# Patient Record
Sex: Male | Born: 1975
Health system: Southern US, Community
[De-identification: ages and names within clinical notes are randomized; demographics above are authoritative.]

## PROBLEM LIST (undated history)

## (undated) DIAGNOSIS — I10 Essential (primary) hypertension: Secondary | ICD-10-CM

## (undated) DIAGNOSIS — B019 Varicella without complication: Secondary | ICD-10-CM

## (undated) HISTORY — PX: NO PAST SURGERIES: SHX2092

## (undated) HISTORY — DX: Varicella without complication: B01.9

## (undated) HISTORY — DX: Essential (primary) hypertension: I10

---

## 2008-10-31 ENCOUNTER — Ambulatory Visit: Payer: Self-pay | Admitting: Cardiovascular Disease

## 2008-11-01 ENCOUNTER — Observation Stay (HOSPITAL_COMMUNITY): Admission: EM | Admit: 2008-11-01 | Discharge: 2008-11-01 | Payer: Self-pay | Admitting: Emergency Medicine

## 2008-11-01 ENCOUNTER — Encounter: Payer: Self-pay | Admitting: Internal Medicine

## 2010-05-11 IMAGING — CR DG CHEST 2V
2 series · 2 of 2 positions shown · non-contrast
Comparison: None

CLINICAL DATA: Left-sided chest pain for 2 weeks

CHEST - 2 VIEW

[w chest pa]
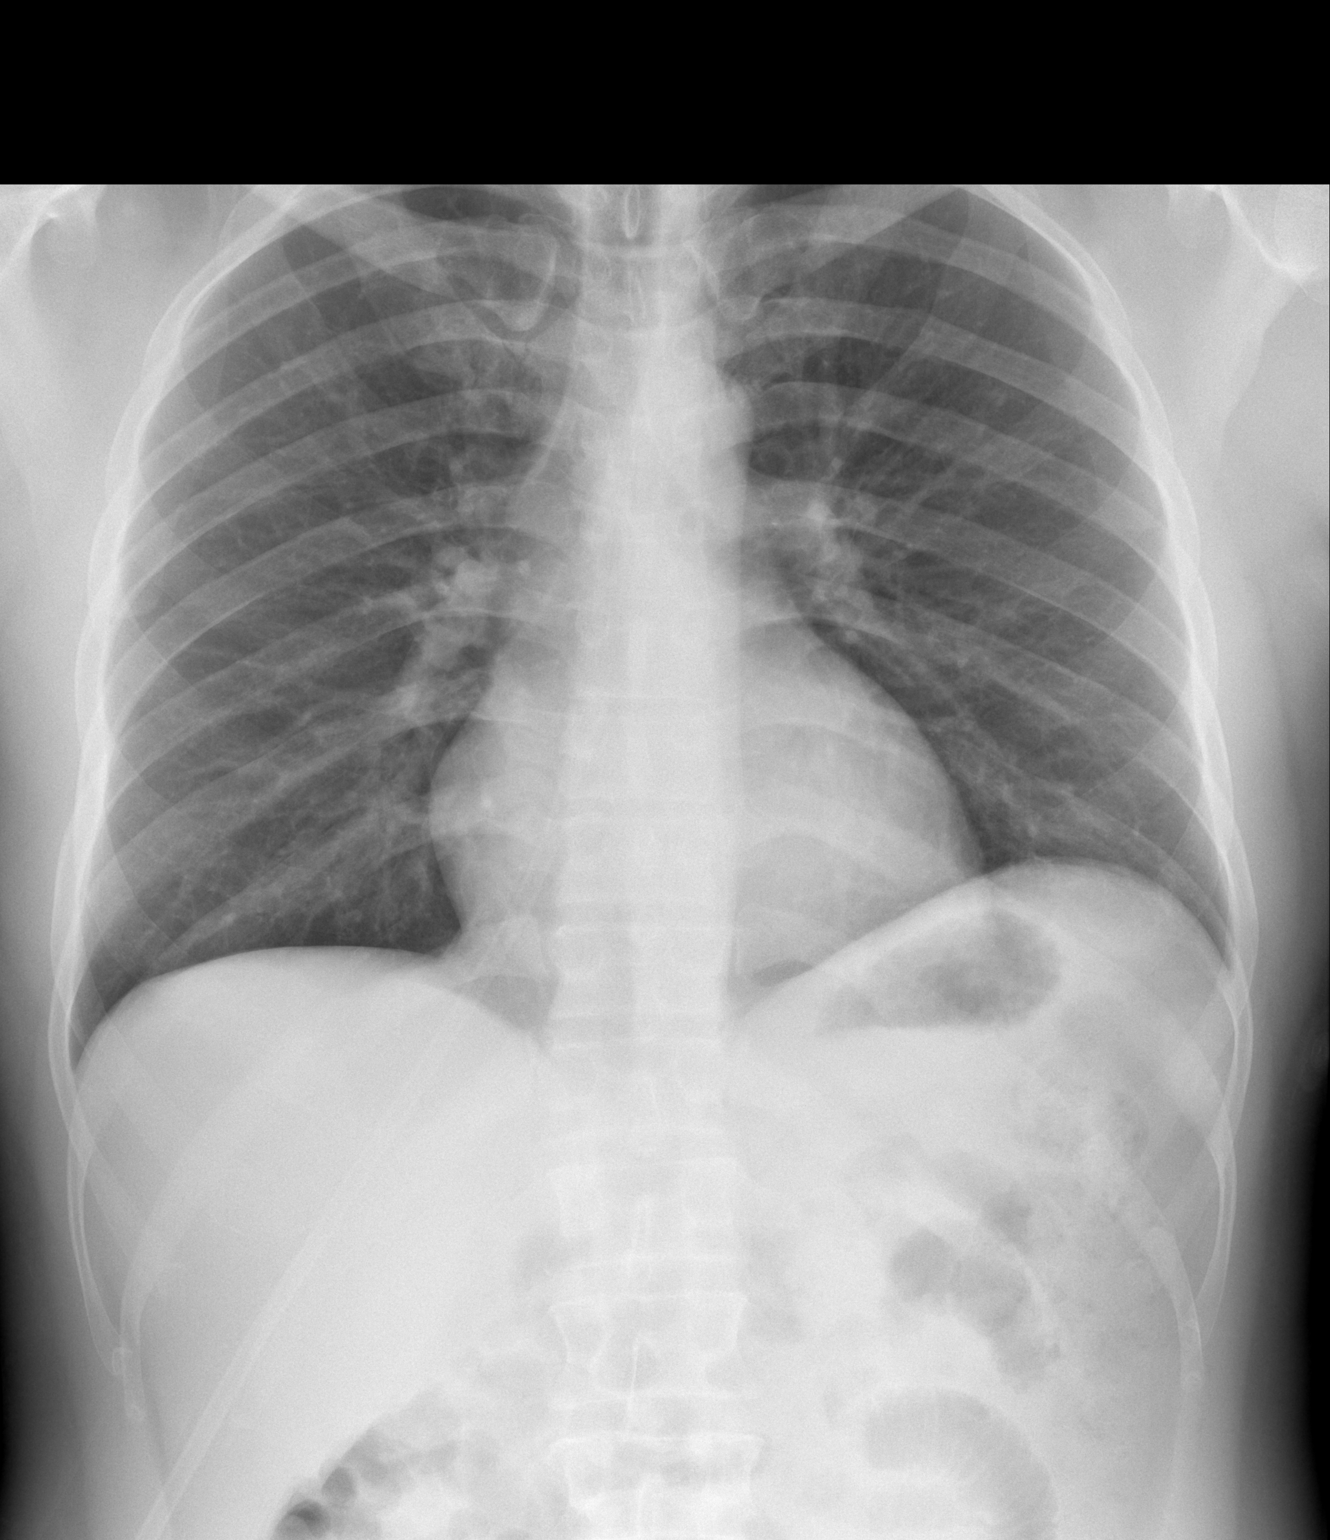

[w chest lat]
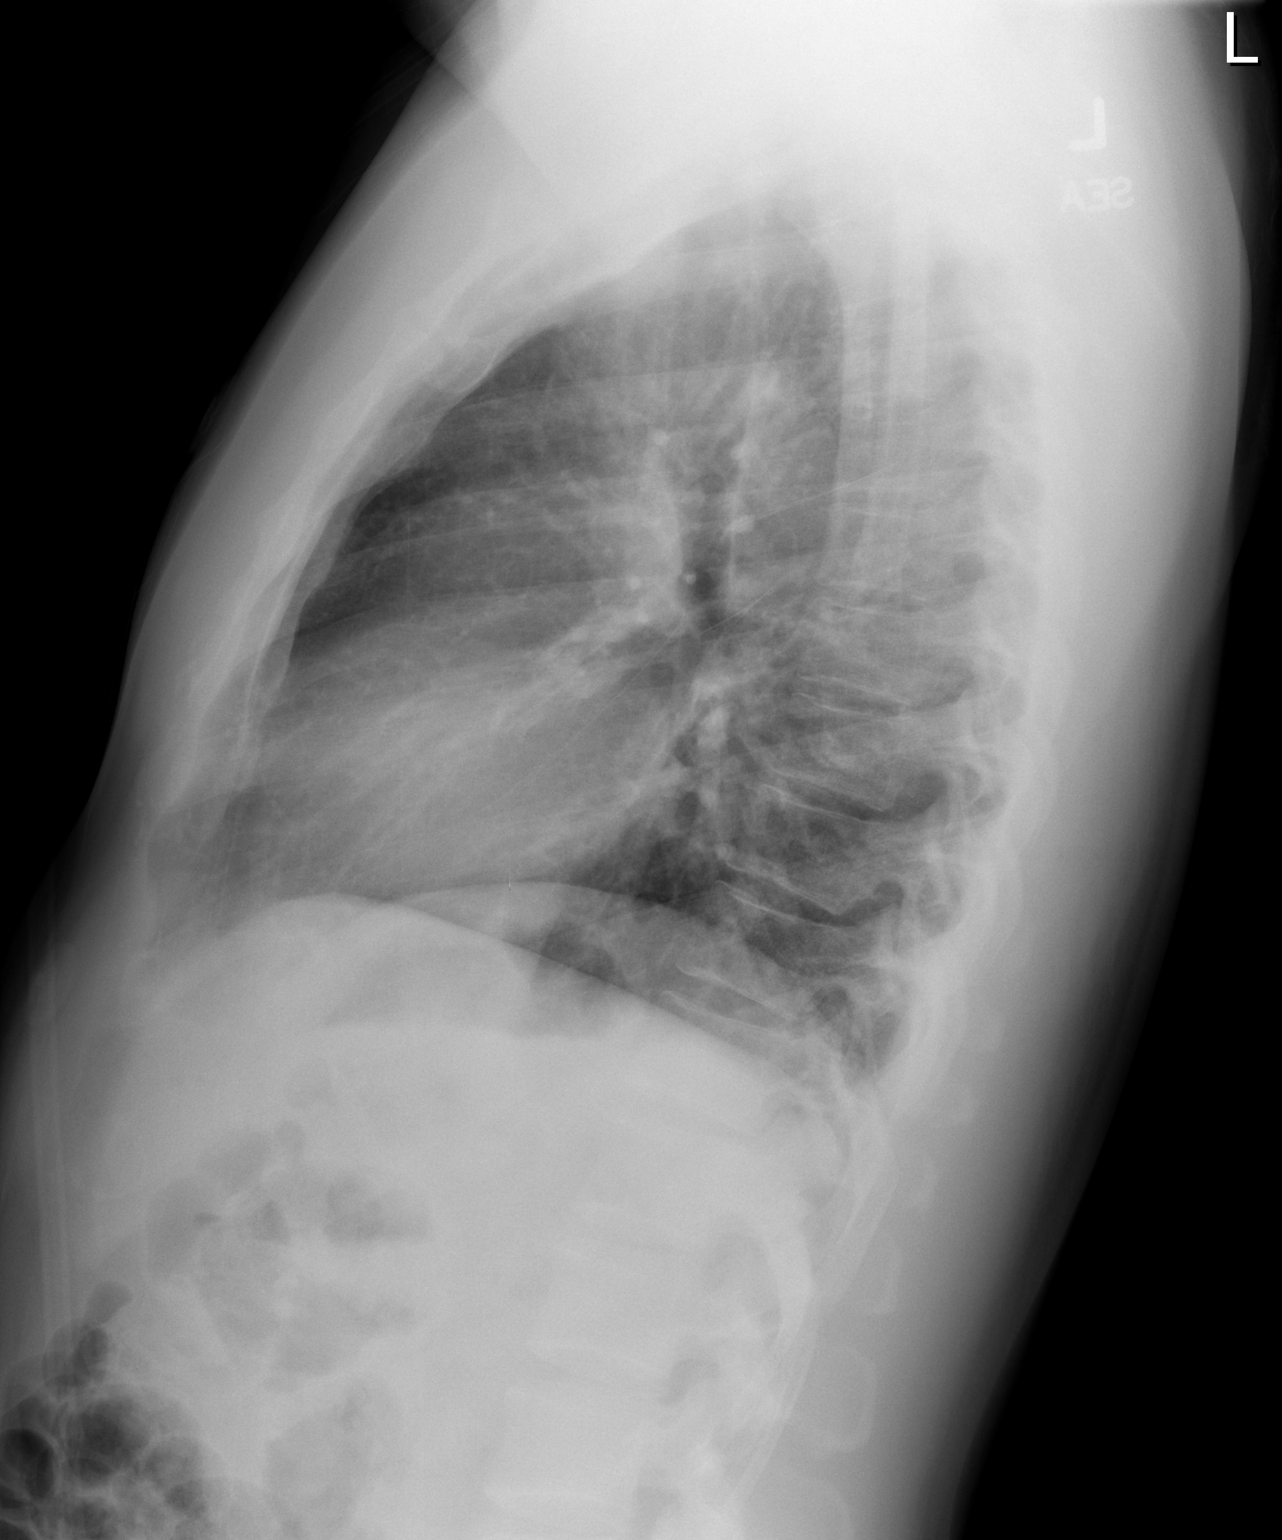

[2 of 2 positions shown; findings below may reference images not displayed]

FINDINGS: No active infiltrate or effusion is seen.  Slightly
prominent perihilar markings are noted.  The heart is within normal
limits in size.  No bony abnormality is seen.
IMPRESSION: No pneumonia.  Slightly prominent perihilar markings.

## 2010-07-01 LAB — DIFFERENTIAL
Basophils Absolute: 0 K/uL (ref 0.0–0.1)
Basophils Relative: 0 % (ref 0–1)
Eosinophils Absolute: 0 K/uL (ref 0.0–0.7)
Eosinophils Relative: 0 % (ref 0–5)
Lymphocytes Relative: 7 % — ABNORMAL LOW (ref 12–46)
Lymphs Abs: 1.2 K/uL (ref 0.7–4.0)
Monocytes Absolute: 1.1 K/uL — ABNORMAL HIGH (ref 0.1–1.0)
Monocytes Relative: 7 % (ref 3–12)
Neutro Abs: 13.9 K/uL — ABNORMAL HIGH (ref 1.7–7.7)
Neutrophils Relative %: 86 % — ABNORMAL HIGH (ref 43–77)

## 2010-07-01 LAB — CK TOTAL AND CKMB (NOT AT ARMC)
CK, MB: 0.5 ng/mL (ref 0.3–4.0)
Relative Index: 0.3 (ref 0.0–2.5)
Total CK: 160 U/L (ref 7–232)

## 2010-07-01 LAB — LIPID PANEL
HDL: 57 mg/dL
Total CHOL/HDL Ratio: 4.2 ratio
Triglycerides: 61 mg/dL
VLDL: 12 mg/dL (ref 0–40)

## 2010-07-01 LAB — URINALYSIS, ROUTINE W REFLEX MICROSCOPIC
Bilirubin Urine: NEGATIVE
Glucose, UA: NEGATIVE mg/dL
Hgb urine dipstick: NEGATIVE
Ketones, ur: NEGATIVE mg/dL
Nitrite: NEGATIVE
Protein, ur: NEGATIVE mg/dL
Specific Gravity, Urine: 1.013 (ref 1.005–1.030)
Urobilinogen, UA: 0.2 mg/dL (ref 0.0–1.0)
pH: 6 (ref 5.0–8.0)

## 2010-07-01 LAB — CBC
HCT: 46.1 % (ref 39.0–52.0)
Hemoglobin: 15.3 g/dL (ref 13.0–17.0)
MCHC: 33.3 g/dL (ref 30.0–36.0)
MCV: 83.9 fL (ref 78.0–100.0)
Platelets: 206 K/uL (ref 150–400)
RBC: 5.49 MIL/uL (ref 4.22–5.81)
RDW: 13.1 % (ref 11.5–15.5)
WBC: 16.2 K/uL — ABNORMAL HIGH (ref 4.0–10.5)

## 2010-07-01 LAB — POCT CARDIAC MARKERS
CKMB, poc: <1 ng/mL — ABNORMAL LOW (ref 1.0–8.0)
Myoglobin, poc: 55 ng/mL (ref 12–200)
Troponin i, poc: <0.05 ng/mL (ref 0.00–0.09)

## 2010-07-01 LAB — CARDIAC PANEL(CRET KIN+CKTOT+MB+TROPI): Relative Index: 0.3 (ref 0.0–2.5)

## 2010-07-01 LAB — SEDIMENTATION RATE: Sed Rate: 3 mm/hr (ref 0–16)

## 2010-07-01 LAB — POCT I-STAT, CHEM 8
BUN: 9 mg/dL (ref 6–23)
Creatinine, Ser: 1.2 mg/dL (ref 0.4–1.5)
Potassium: 4.1 mEq/L (ref 3.5–5.1)
Sodium: 138 mEq/L (ref 135–145)
TCO2: 27 mmol/L (ref 0–100)

## 2010-07-01 LAB — RAPID URINE DRUG SCREEN, HOSP PERFORMED: Barbiturates: NOT DETECTED

## 2010-07-01 LAB — TROPONIN I

## 2010-08-07 NOTE — H&P (Signed)
NAME:  Ruben Welch, TISO NO.:  [id number]   MEDICAL RECORD NO.:  [id number]          PATIENT TYPE:  OBV   LOCATION:  3707                         FACILITY:  [facility]   PHYSICIAN:  Christell Faith, MD   DATE OF BIRTH:  1975/04/02   DATE OF ADMISSION:  10/31/2008  DATE OF DISCHARGE:                              HISTORY & PHYSICAL   CHIEF COMPLAINT:  Chest pain.   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  Initially, the pain was mild, but it progressed over the course of 2-3  hours to become quite severe.  He recalled that it was exacerbated by  direct palpation over the left pectoralis muscle.  He drove from [place]  where he works, to an Urgent Care Center in [place], where  electrocardiogram was performed, which revealed inferolateral T wave  inversions, and I was contacted by the urgent care physician for  possible acute coronary syndrome.  The patient was transferred to the  [facility] Emergency Department and upon arrival is pain free and  asymptomatic.  For the past week and a half, he has felt very mild  twinges of pain in his left axilla, which he has attributed to  musculoskeletal in nature, however, today's was more diffuse and more  severe.  He jogs and is otherwise a young active healthy person and  reports no history of chest pain or dyspnea with exertion.   PAST MEDICAL HISTORY:  Possible heart murmur as a child.   FAMILY HISTORY:  Mother and father both alive, both with hypertension.   SOCIAL HISTORY:  He is married.  He and his wife live in [place],  however, he is a Psychologist, occupational in [place].  No tobacco.  No illegal drugs.  No  alcohol.   DRUG ALLERGIES:  None.   MEDICATIONS:  None including no herbs, supplements, or over-the-counter  medicines.   REVIEW OF SYSTEMS:  He  otherwise has no complaints.   PHYSICAL EXAMINATION:  VITAL SIGNS:  Temperature 98.9, pulse 88,  respiratory rate 18, blood pressure presently 127/81, initially at the  Urgent Care Center it is 178/106.  GENERAL:  This is a pleasant, young, healthy-appearing man, in no acute  distress.  HEENT:  Pupils are round and reactive.  Funduscopic exam reveals no  evidence of chronic hypertension.  NECK:  Supple.  Neck veins are flat.  No carotid bruits.  There is no  axillary lymphadenopathy.  LUNGS:  Clear to auscultation bilaterally without wheezing or rales.  CARDIAC:  Normal rate, regular rhythm.  No murmur.  No rub.  ABDOMEN:  Soft, nontender, nondistended.  Normal bowel sounds.  EXTREMITIES:  No edema.  2+ dorsalis pedis and radial pulses  bilaterally.  NEUROLOGIC:  5/5 strength in all 4 extremities.  Facial expression  symmetric.   DIAGNOSTIC TESTS:  Chest x-ray shows slightly prominent perihilar  markings, otherwise normal.  EKG shows sinus rhythm at 70 beats per  minute with  inferolateral T wave inversions that have the appearance of  changes secondary to left ventricular hypertrophy.  However, normal  variant and ischemic changes cannot be entirely excluded.  We have no  old EKG for comparison.  Point-of-care cardiac enzymes were undetectable  x2.  Sodium 138, potassium 4.1, BUN 9, creatinine 1.2, white blood cells  16, hemoglobin 15.   IMPRESSION:  This is a 35 year old African American male with no  cardiovascular risk factors who had an episode of atypical chest pain  today, however, his EKG is abnormal, and he has an elevated white blood  cell count.   PLAN:  1. Based on the abnormal EKG, I think it is most prudent to admit him      to an observation unit overnight.  He will be placed on telemetry.      Myocardial infarction will be ruled out with serial EKGs and      cardiac enzymes.  2. Given his family history of hypertension and childhood murmur, we      will obtain an  echocardiogram to evaluate for left ventricular      hypertrophy or wall motion abnormalities.  3. Urine drug screen.  4. Continue aspirin, however we will withhold anticoagulation unless      his cardiac enzymes become abnormal or his pain recurs.  5. Check D-dimer to rule out venous thromboembolism.  6. The most likely etiology of his symptoms is an inflammatory      process, which will probably work itself out, however I had a long      discussion with the patient and his wife that the safest course of      action is to be observed overnight in the hospital and he      reluctantly agrees with this plan.  Further testing if needed could      possibly be deferred to the outpatient setting.      Christell Faith, MD  Electronically Signed     NDL/MEDQ  D:  10/31/2008  T:  11/01/2008  Job:  [id number]

## 2010-08-10 NOTE — Discharge Summary (Signed)
NAME:  Ruben Welch, Ruben Welch NO.:  [id number]   MEDICAL RECORD NO.:  [id number]          PATIENT TYPE:  OBV   LOCATION:  3707                         FACILITY:  [facility]   PHYSICIAN:  Verne Carrow, MDDATE OF BIRTH:  02-12-1976   DATE OF ADMISSION:  10/31/2008  DATE OF DISCHARGE:  11/01/2008                               DISCHARGE SUMMARY   HOSPITAL COURSE:  Mr. Muegge is a 35 year old African American male  who had a sudden onset of chest pain and was admitted to the hospital.  He had no significant past medical or past surgical history.  His first  3 sets of cardiac enzymes were negative.  His D-dimer was negative.  Plans were made for an echocardiogram and a stress Myoview to be  performed in the hospital.  The patient left the hospital against  medical advice prior to having the results of his testing communicated.  His echocardiogram showed normal cavity size with normal wall thickness.  Ejection fraction was estimated at 65%.  Wall motion was normal.  There  were no regional wall motion abnormalities.  There was what appeared to  be diastolic dysfunction.  The mitral valve annulus was calcified.  The  patient left prior to his stress test being performed.   DISCHARGE DIAGNOSIS:  Chest pain with normal left ventricular function.   FOLLOWUP:  No followup was scheduled since the patient left against  medical advice.   DISCHARGE MEDICATIONS:  None.   As stated above, the patient left against medical advice before the  recommended testing was performed.  We discussed with the patient that  it would be medically indicated for him to have the testing completed;  however, he refused to do so.      Verne Carrow, MD  Electronically Signed     CM/MEDQ  D:  11/21/2008  T:  11/22/2008  Job:  [id number]

## 2013-08-02 ENCOUNTER — Ambulatory Visit: Payer: Self-pay | Admitting: Internal Medicine

## 2013-08-03 ENCOUNTER — Encounter: Payer: Self-pay | Admitting: Internal Medicine

## 2013-08-03 ENCOUNTER — Ambulatory Visit (INDEPENDENT_AMBULATORY_CARE_PROVIDER_SITE_OTHER): Payer: BC Managed Care – PPO | Admitting: Internal Medicine

## 2013-08-03 VITALS — BP 198/130 | HR 82 | Temp 98.3°F | Ht 66.75 in | Wt 173.8 lb

## 2013-08-03 DIAGNOSIS — Z0000 Encounter for general adult medical examination without abnormal findings: Secondary | ICD-10-CM | POA: Insufficient documentation

## 2013-08-03 DIAGNOSIS — I10 Essential (primary) hypertension: Secondary | ICD-10-CM

## 2013-08-03 LAB — LIPID PANEL
CHOLESTEROL: 243 mg/dL — AB (ref 0–200)
HDL: 55.2 mg/dL (ref >39.00)
LDL Cholesterol: 166 mg/dL — ABNORMAL HIGH (ref 0–99)
TRIGLYCERIDES: 110 mg/dL (ref 0.0–149.0)
Total CHOL/HDL Ratio: 4
VLDL: 22 mg/dL (ref 0.0–40.0)

## 2013-08-03 LAB — COMPREHENSIVE METABOLIC PANEL
ALBUMIN: 4.3 g/dL (ref 3.5–5.2)
ALK PHOS: 38 U/L — AB (ref 39–117)
ALT: 29 U/L (ref 0–53)
AST: 23 U/L (ref 0–37)
BUN: 11 mg/dL (ref 6–23)
CALCIUM: 9.3 mg/dL (ref 8.4–10.5)
CHLORIDE: 101 meq/L (ref 96–112)
CO2: 30 mEq/L (ref 19–32)
CREATININE: 1.2 mg/dL (ref 0.4–1.5)
GFR: 89.64 mL/min (ref >60.00)
GLUCOSE: 82 mg/dL (ref 70–99)
POTASSIUM: 3.9 meq/L (ref 3.5–5.1)
Sodium: 137 mEq/L (ref 135–145)
Total Bilirubin: 0.7 mg/dL (ref 0.2–1.2)
Total Protein: 8 g/dL (ref 6.0–8.3)

## 2013-08-03 LAB — CBC
HCT: 45 % (ref 39.0–52.0)
HEMOGLOBIN: 14.9 g/dL (ref 13.0–17.0)
MCHC: 33.1 g/dL (ref 30.0–36.0)
MCV: 83.6 fl (ref 78.0–100.0)
PLATELETS: 228 10*3/uL (ref 150.0–400.0)
RBC: 5.39 Mil/uL (ref 4.22–5.81)
RDW: 13.8 % (ref 11.5–15.5)
WBC: 7.5 10*3/uL (ref 4.0–10.5)

## 2013-08-03 LAB — TSH: TSH: 1.79 u[IU]/mL (ref 0.35–4.50)

## 2013-08-03 MED ORDER — LISINOPRIL-HYDROCHLOROTHIAZIDE 20-25 MG PO TABS: 1.0000 | ORAL_TABLET | Freq: Every day | ORAL | Status: DC

## 2013-08-03 NOTE — Assessment & Plan Note (Signed)
Will check CBC, CMET today Will start lisinopril HCT today  RTC in 2 weeks to recheck BP  May need renal ultrasound in the future if BP remains elevated

## 2013-08-03 NOTE — Progress Notes (Signed)
HPI  Pt presents to the clinic today to establish care. He does not have a prior PCP. He does have some concerns today about his blood pressure. He reports that he has been checking it at the pharmacy over the last year and it has been very elevated. He has never been treated for high blood pressure in the past. He has been having headaches lately but thinks it because his eye prescriptions was due. He did get a new prescription 2 weeks ago and the headaches. He does not add salt to his diet.  Flu: 8/14 Tetanus: 2014 Dentist: biannually  Past Medical History  Diagnosis Date  . Chicken pox     Current Outpatient Prescriptions  Medication Sig Dispense Refill  . Omega-3 Fatty Acids (FISH OIL) 300 MG CAPS Take 2 capsules by mouth daily.       No current facility-administered medications for this visit.    No Known Allergies  Family History  Problem Relation Age of Onset  . Hypertension Mother   . Stroke Father   . Hypertension Father   . Kidney disease Maternal Grandmother     History   Social History  . Marital Status: Married    Spouse Name: N/A    Number of Children: N/A  . Years of Education: N/A   Occupational History  . Not on file.   Social History Main Topics  . Smoking status: Never Smoker   . Smokeless tobacco: Never Used  . Alcohol Use: Yes     Comment: moderate  . Drug Use: No  . Sexual Activity: Not on file   Other Topics Concern  . Not on file   Social History Narrative  . No narrative on file    ROS:  Constitutional: Pt reports headache. Denies fever, malaise, fatigue, headache or abrupt weight changes.  HEENT: Denies eye pain, eye redness, ear pain, ringing in the ears, wax buildup, runny nose, nasal congestion, bloody nose, or sore throat. Respiratory: Denies difficulty breathing, shortness of breath, cough or sputum production.   Cardiovascular: Denies chest pain, chest tightness, palpitations or swelling in the hands or feet.   Gastrointestinal: Denies abdominal pain, bloating, constipation, diarrhea or blood in the stool.  GU: Denies frequency, urgency, pain with urination, blood in urine, odor or discharge. Musculoskeletal: Denies decrease in range of motion, difficulty with gait, muscle pain or joint pain and swelling.  Skin: Denies redness, rashes, lesions or ulcercations.  Neurological: Denies dizziness, difficulty with memory, difficulty with speech or problems with balance and coordination.   No other specific complaints in a complete review of systems (except as listed in HPI above).  PE:  BP 198/130  Pulse 82  Temp(Src) 98.3 F (36.8 C) (Oral)  Ht 5' 6.75" (1.695 m)  Wt 173 lb 12 oz (78.812 kg)  BMI 27.43 kg/m2 Wt Readings from Last 3 Encounters:  08/03/13 173 lb 12 oz (78.812 kg)    General: Appears his stated age, well developed, well nourished in NAD. HEENT: Head: normal shape and size; Eyes: sclera white, no icterus, conjunctiva pink, PERRLA and EOMs intact; Ears: Tm's gray and intact, normal light reflex; Nose: mucosa pink and moist, septum midline; Throat/Mouth: Teeth present, mucosa pink and moist, no lesions or ulcerations noted.  Neck: Normal range of motion. Neck supple, trachea midline. No massses, lumps or thyromegaly present.  Cardiovascular: Normal rate and rhythm. S1,S2 noted.  No murmur, rubs or gallops noted. No JVD or BLE edema. No carotid bruits noted. Pulmonary/Chest: Normal effort  and positive vesicular breath sounds. No respiratory distress. No wheezes, rales or ronchi noted.  Abdomen: Soft and nontender. Normal bowel sounds, no bruits noted. No distention or masses noted. Liver, spleen and kidneys non palpable. Musculoskeletal: Normal range of motion. No signs of joint swelling. No difficulty with gait.  Neurological: Alert and oriented. Cranial nerves II-XII intact. Coordination normal. +DTRs bilaterally. Psychiatric: Mood and affect normal. Behavior is normal. Judgment and  thought content normal.     BMET    Component Value Date/Time   NA 138 10/31/2008 2209   K 4.1 10/31/2008 2209   CL 101 10/31/2008 2209   GLUCOSE 105* 10/31/2008 2209   BUN 9 10/31/2008 2209   CREATININE 1.2 10/31/2008 2209    Lipid Panel     Component Value Date/Time   CHOL  Value: 239        ATP III CLASSIFICATION:  <200     mg/dL   Desirable  161-096200-239  mg/dL   Borderline High  >=045>=240    mg/dL   High       * 4/09/81198/12/2008 0625   TRIG 61 11/01/2008 0625   HDL 57 11/01/2008 0625   CHOLHDL 4.2 11/01/2008 0625   VLDL 12 11/01/2008 0625   LDLCALC  Value: 170        Total Cholesterol/HDL:CHD Risk Coronary Heart Disease Risk Table                     Men   Women  1/2 Average Risk   3.4   3.3  Average Risk       5.0   4.4  2 X Average Risk   9.6   7.1  3 X Average Risk  23.4   11.0        Use the calculated Patient Ratio above and the CHD Risk Table to determine the patient's CHD Risk.        ATP III CLASSIFICATION (LDL):  <100     mg/dL   Optimal  147-829100-129  mg/dL   Near or Above                    Optimal  130-159  mg/dL   Borderline  562-130160-189  mg/dL   High  >865>190     mg/dL   Very High* 7/84/69628/12/2008 0625    CBC    Component Value Date/Time   WBC 16.2* 10/31/2008 2205   RBC 5.49 10/31/2008 2205   HGB 16.7 10/31/2008 2209   HCT 49.0 10/31/2008 2209   PLT 206 10/31/2008 2205   MCV 83.9 10/31/2008 2205   MCHC 33.3 10/31/2008 2205   RDW 13.1 10/31/2008 2205   LYMPHSABS 1.2 10/31/2008 2205   MONOABS 1.1* 10/31/2008 2205   EOSABS 0.0 10/31/2008 2205   BASOSABS 0.0 10/31/2008 2205       Assessment and Plan:  Preventative Health Maintenance:  All HM UTD Will check basic screening labs today

## 2013-08-03 NOTE — Patient Instructions (Signed)

## 2013-08-03 NOTE — Progress Notes (Signed)
Pre visit review using our clinic review tool, if applicable. No additional management support is needed unless otherwise documented below in the visit note. 

## 2013-08-04 ENCOUNTER — Telehealth: Payer: Self-pay | Admitting: Internal Medicine

## 2013-08-04 NOTE — Telephone Encounter (Signed)
Relevant patient education mailed to patient.  

## 2013-08-10 ENCOUNTER — Telehealth: Payer: Self-pay

## 2013-08-10 NOTE — Telephone Encounter (Signed)
Pt states he will try medicine as advised and will call to make f/u if needed

## 2013-08-10 NOTE — Telephone Encounter (Signed)
Coughing consistently all day and real hacky dry cough--pt denies any other Sx and states that it is all day every 10 minutes--pt wants to know if he can take something for the cough--please advise--will CB pt with response

## 2013-08-10 NOTE — Telephone Encounter (Signed)
lmovm for pt to return call.  

## 2013-08-10 NOTE — Telephone Encounter (Signed)
Pt left v/m; pt was seen 08/03/13 and started lisinopril HCTZ; pt has developed a cough; difficult to sleep at night due to cough and also coughing thru out the day as well. Pt request cb. [facility] [address]

## 2013-08-10 NOTE — Telephone Encounter (Signed)
Is it a dry cough or a productive cough? Fever? Other symptoms?

## 2013-08-10 NOTE — Telephone Encounter (Signed)
Have him try coricidin OTC. If no improvement in 1 week, should followup, may need to change BP medication

## 2013-08-12 ENCOUNTER — Telehealth: Payer: Self-pay

## 2013-08-12 NOTE — Telephone Encounter (Signed)
Pt has been takiing coricidin OTC with no relief; pt thinks cough has worsened; cannot come in for appt today but scheduled appt 08/13/13 at 9:15 am.

## 2013-08-13 ENCOUNTER — Ambulatory Visit (INDEPENDENT_AMBULATORY_CARE_PROVIDER_SITE_OTHER): Payer: BC Managed Care – PPO | Admitting: Internal Medicine

## 2013-08-13 ENCOUNTER — Encounter: Payer: Self-pay | Admitting: Internal Medicine

## 2013-08-13 VITALS — BP 168/100 | HR 104 | Temp 99.0°F | Wt 170.5 lb

## 2013-08-13 DIAGNOSIS — R05 Cough: Secondary | ICD-10-CM

## 2013-08-13 DIAGNOSIS — R059 Cough, unspecified: Secondary | ICD-10-CM

## 2013-08-13 DIAGNOSIS — R058 Other specified cough: Secondary | ICD-10-CM

## 2013-08-13 DIAGNOSIS — T464X5A Adverse effect of angiotensin-converting-enzyme inhibitors, initial encounter: Secondary | ICD-10-CM

## 2013-08-13 DIAGNOSIS — I10 Essential (primary) hypertension: Secondary | ICD-10-CM

## 2013-08-13 DIAGNOSIS — T465X5A Adverse effect of other antihypertensive drugs, initial encounter: Secondary | ICD-10-CM

## 2013-08-13 MED ORDER — LOSARTAN POTASSIUM-HCTZ 50-12.5 MG PO TABS: 1.0000 | ORAL_TABLET | Freq: Every day | ORAL | Status: DC

## 2013-08-13 NOTE — Patient Instructions (Signed)

## 2013-08-13 NOTE — Progress Notes (Signed)
Subjective:    Patient ID: Ruben Welch, male    DOB: Jul 28, 1975, 38 y.o.   MRN: [id number]  HPI  Pt presents to the clinic today with c/o a constant, dry hacking cough. He reports this started shortly after starting on the lisinopril. The cough is unproductive. He denies runny nose, watery eyes, ear fullness, headache, scratchy throat, fever, chills or body aches. He did try coricidin OTC without any relief. He has not taken any of his medication in 2 days and has already noticed a decrease in the coughing.  Review of Systems      Past Medical History  Diagnosis Date  . Chicken pox     Current Outpatient Prescriptions  Medication Sig Dispense Refill  . lisinopril-hydrochlorothiazide (PRINZIDE,ZESTORETIC) 20-25 MG per tablet Take 1 tablet by mouth daily.  30 tablet  0  . Omega-3 Fatty Acids (FISH OIL) 300 MG CAPS Take 2 capsules by mouth daily.       No current facility-administered medications for this visit.    No Known Allergies  Family History  Problem Relation Age of Onset  . Hypertension Mother   . Stroke Father   . Hypertension Father   . Kidney disease Maternal Grandmother   . Cancer Neg Hx   . Diabetes Neg Hx     History   Social History  . Marital Status: Married    Spouse Name: N/A    Number of Children: N/A  . Years of Education: N/A   Occupational History  . Not on file.   Social History Main Topics  . Smoking status: Never Smoker   . Smokeless tobacco: Never Used  . Alcohol Use: Yes     Comment: moderate  . Drug Use: No  . Sexual Activity: Not on file   Other Topics Concern  . Not on file   Social History Narrative  . No narrative on file     Constitutional: Denies fever, malaise, fatigue, headache or abrupt weight changes.  HEENT: Denies eye pain, eye redness, ear pain, ringing in the ears, wax buildup, runny nose, nasal congestion, bloody nose, or sore throat. Respiratory: Pt reports cough. Denies difficulty breathing,  shortness of breath, or sputum production.   Cardiovascular: Denies chest pain, chest tightness, palpitations or swelling in the hands or feet.     No other specific complaints in a complete review of systems (except as listed in HPI above).  Objective:   Physical Exam  BP 168/100  Pulse 104  Temp(Src) 99 F (37.2 C) (Oral)  Wt 170 lb 8 oz (77.338 kg)  SpO2 99% Wt Readings from Last 3 Encounters:  08/13/13 170 lb 8 oz (77.338 kg)  08/03/13 173 lb 12 oz (78.812 kg)    General: Appears his stated age, well developed, well nourished in NAD. HEENT: Head: normal shape and size; Eyes: sclera white, no icterus, conjunctiva pink, PERRLA and EOMs intact; Ears: Tm's gray and intact, normal light reflex; Nose: mucosa pink and moist, septum midline; Throat/Mouth: Teeth present, mucosa pink and moist, no exudate, lesions or ulcerations noted.  Cardiovascular: Normal rate and rhythm. S1,S2 noted.  No murmur, rubs or gallops noted. No JVD or BLE edema. No carotid bruits noted. Pulmonary/Chest: Normal effort and positive vesicular breath sounds. No respiratory distress. No wheezes, rales or ronchi noted.    BMET    Component Value Date/Time   NA 137 08/03/2013 1411   K 3.9 08/03/2013 1411   CL 101 08/03/2013 1411   CO2 30 08/03/2013  1411   GLUCOSE 82 08/03/2013 1411   BUN 11 08/03/2013 1411   CREATININE 1.2 08/03/2013 1411   CALCIUM 9.3 08/03/2013 1411    Lipid Panel     Component Value Date/Time   CHOL 243* 08/03/2013 1411   TRIG 110.0 08/03/2013 1411   HDL 55.20 08/03/2013 1411   CHOLHDL 4 08/03/2013 1411   VLDL 22.0 08/03/2013 1411   LDLCALC 166* 08/03/2013 1411    CBC    Component Value Date/Time   WBC 7.5 08/03/2013 1411   RBC 5.39 08/03/2013 1411   HGB 14.9 08/03/2013 1411   HCT 45.0 08/03/2013 1411   PLT 228.0 08/03/2013 1411   MCV 83.6 08/03/2013 1411   MCHC 33.1 08/03/2013 1411   RDW 13.8 08/03/2013 1411   LYMPHSABS 1.2 10/31/2008 2205   MONOABS 1.1* 10/31/2008 2205   EOSABS 0.0  10/31/2008 2205   BASOSABS 0.0 10/31/2008 2205    Hgb A1C No results found for this basename: HGBA1C         Assessment & Plan:   Lisinopril induced cough:  Will d/c lisinopril HCT due to side effects Will start losartan HCT for BP Advised him that it may take 2-3 weeks for the cough to subside  RTC in 1 month to recheck BP

## 2013-08-13 NOTE — Progress Notes (Signed)
Pre visit review using our clinic review tool, if applicable. No additional management support is needed unless otherwise documented below in the visit note. 

## 2013-08-17 ENCOUNTER — Telehealth: Payer: Self-pay | Admitting: Internal Medicine

## 2013-08-17 ENCOUNTER — Ambulatory Visit: Payer: BC Managed Care – PPO | Admitting: Internal Medicine

## 2013-08-17 NOTE — Telephone Encounter (Signed)
Relevant patient education assigned to patient using Emmi. ° °

## 2013-09-13 ENCOUNTER — Encounter: Payer: Self-pay | Admitting: Internal Medicine

## 2013-09-13 ENCOUNTER — Ambulatory Visit (INDEPENDENT_AMBULATORY_CARE_PROVIDER_SITE_OTHER): Payer: BC Managed Care – PPO | Admitting: Internal Medicine

## 2013-09-13 VITALS — BP 168/108 | HR 80 | Temp 97.8°F | Wt 175.0 lb

## 2013-09-13 DIAGNOSIS — I10 Essential (primary) hypertension: Secondary | ICD-10-CM

## 2013-09-13 NOTE — Progress Notes (Signed)
Subjective:    Patient ID: Ruben SangerQuantavis Culliver, male    DOB: 1975-09-02, 38 y.o.   MRN: [id number]  HPI  Pt presents to the clinic today for 1 month follow up for BP. He was having issues with a cough while on lisinopril-HCT. We finally stopped it, and switched him to Losartan-HCT. The cough did originally go away after stopping the lisinopril but has returned in the last 1 week. He is coughing every 2-3 hours x 1 week. He reports he has not taken his BP medication in the last 2 days because he went out of town and forgot it. His blood pressure today is 168/108.  Review of Systems      Past Medical History  Diagnosis Date  . Chicken pox     Current Outpatient Prescriptions  Medication Sig Dispense Refill  . losartan-hydrochlorothiazide (HYZAAR) 50-12.5 MG per tablet Take 1 tablet by mouth daily.  30 tablet  2  . Omega-3 Fatty Acids (FISH OIL) 300 MG CAPS Take 2 capsules by mouth daily.       No current facility-administered medications for this visit.    No Known Allergies  Family History  Problem Relation Age of Onset  . Hypertension Mother   . Stroke Father   . Hypertension Father   . Kidney disease Maternal Grandmother   . Cancer Neg Hx   . Diabetes Neg Hx     History   Social History  . Marital Status: Married    Spouse Name: N/A    Number of Children: N/A  . Years of Education: N/A   Occupational History  . Not on file.   Social History Main Topics  . Smoking status: Never Smoker   . Smokeless tobacco: Never Used  . Alcohol Use: Yes     Comment: moderate  . Drug Use: No  . Sexual Activity: Not on file   Other Topics Concern  . Not on file   Social History Narrative  . No narrative on file     Constitutional: Denies fever, malaise, fatigue, headache or abrupt weight changes.  HEENT: Denies eye pain, eye redness, ear pain, ringing in the ears, wax buildup, runny nose, nasal congestion, bloody nose, or sore throat. Respiratory: Pt reports cough.  Denies difficulty breathing, shortness of breath, or sputum production.   Cardiovascular: Denies chest pain, chest tightness, palpitations or swelling in the hands or feet.  Neurological: Denies dizziness, difficulty with memory, difficulty with speech or problems with balance and coordination.   No other specific complaints in a complete review of systems (except as listed in HPI above).  Objective:   Physical Exam   BP 168/108  Pulse 80  Temp(Src) 97.8 F (36.6 C) (Oral)  Wt 175 lb (79.379 kg) Wt Readings from Last 3 Encounters:  09/13/13 175 lb (79.379 kg)  08/13/13 170 lb 8 oz (77.338 kg)  08/03/13 173 lb 12 oz (78.812 kg)    General: Appears his stated age, well developed, well nourished in NAD. HEENT: Head: normal shape and size; Eyes: sclera white, no icterus, conjunctiva pink, PERRLA and EOMs intact; Ears: Tm's gray and intact, normal light reflex; Nose: mucosa pink and moist, septum midline; Throat/Mouth: Teeth present, mucosa pink and moist, no exudate, lesions or ulcerations noted.  Cardiovascular: Normal rate and rhythm. S1,S2 noted.  No murmur, rubs or gallops noted. No JVD or BLE edema. No carotid bruits noted. Pulmonary/Chest: Normal effort and positive vesicular breath sounds. No respiratory distress. No wheezes, rales or ronchi noted.  BMET    Component Value Date/Time   NA 137 08/03/2013 1411   K 3.9 08/03/2013 1411   CL 101 08/03/2013 1411   CO2 30 08/03/2013 1411   GLUCOSE 82 08/03/2013 1411   BUN 11 08/03/2013 1411   CREATININE 1.2 08/03/2013 1411   CALCIUM 9.3 08/03/2013 1411    Lipid Panel     Component Value Date/Time   CHOL 243* 08/03/2013 1411   TRIG 110.0 08/03/2013 1411   HDL 55.20 08/03/2013 1411   CHOLHDL 4 08/03/2013 1411   VLDL 22.0 08/03/2013 1411   LDLCALC 166* 08/03/2013 1411    CBC    Component Value Date/Time   WBC 7.5 08/03/2013 1411   RBC 5.39 08/03/2013 1411   HGB 14.9 08/03/2013 1411   HCT 45.0 08/03/2013 1411   PLT 228.0 08/03/2013  1411   MCV 83.6 08/03/2013 1411   MCHC 33.1 08/03/2013 1411   RDW 13.8 08/03/2013 1411   LYMPHSABS 1.2 10/31/2008 2205   MONOABS 1.1* 10/31/2008 2205   EOSABS 0.0 10/31/2008 2205   BASOSABS 0.0 10/31/2008 2205    Hgb A1C No results found for this basename: HGBA1C        Assessment & Plan:

## 2013-09-13 NOTE — Progress Notes (Signed)
Pre visit review using our clinic review tool, if applicable. No additional management support is needed unless otherwise documented below in the visit note. 

## 2013-09-13 NOTE — Assessment & Plan Note (Addendum)
BP elevated today but has not taken medication x 2 days Still c/o cough, will treat for allergies with zyrtec to see if any improvement Continue current dose of Losartan-HCT RX given for DME- blood pressure cuff  RTC in 2 week- 1 month for BP check

## 2013-09-13 NOTE — Patient Instructions (Signed)
Hypertension (High Blood Pressure) Hypertension is the medical term for higher than normal blood pressure. Hypertension occurs in people of all ages, but is common as a persistent problem when people age. There are many times when blood pressure increases above "normal" levels, such as during exercise or when afraid. However, after these periods, blood pressure returns to normal levels. Persistent (chronic) hypertension makes the heart, kidneys, and blood vessels work harder. This puts people with hypertension at an increased risk for heart attack, stroke, kidney disease, and peripheral vascular disease (obstruction of arteries). There are two types of hypertension: essential and unessential. Up to 70% of affected people have essential hypertension, which means the reason for their disease is unknown. SYMPTOMS   Hypertension often has no symptoms, especially in the early stages of the disease.  Headache.  Nosebleeds.  Numbness and tingling of the hands and feet (paresthesis).  Shortness of breath.  Rapid heart rate, felt in chest (palpitations).  Decrease in athletic performance. CAUSES   Anabolic steroid use.  Stimulants (caffeine, cocaine, Ma-huang, phenyl-propanolamine, amphetamines).  Obesity.  Kidney disease.  Diseases of the adrenal glands.  Too much alcohol use.  Too much sodium (salt) intake.  Narrowing of the large blood vessels of the body (coarctation of the aorta).  Vascular disease. RISK INCREASES WITH:  Obesity.  Inactive lifestyle.  Use of anabolic steroids.  High sodium diet.  Use of stimulants (cocaine, Ma-huang, amphetamines, ephedrine).  Too much alcohol use.  Family history of hypertension.  Age (especially older than 4). PREVENTION  Hypertension cannot be prevented, only controlled.  Good control prevents complications and improves athletic performance.  To prevent hypertension from getting out of control:  Avoid drinking too much  alcohol.  Avoid excessive use of stimulants.  Be sure that intense exercise does not worsen blood pressure.  Do not smoke.  Avoid isometric exercises.  Do not hold breath when lifting weights. Exhale during the difficult part of the lift. PROGNOSIS   Without proper treatment, hypertension can shorten the life span and reduce quality of life.  If treated properly, hypertension does not affect quality of life or life expectancy, and athletes can maintain their level of play.  Life expectancy is normal or close to normal, with good control of blood pressure. RELATED COMPLICATIONS  Heart attack.  Stroke.  Kidney failure.  Heart failure.  Poor tolerance of exercise. TREATMENT  Diagnostic tests may be given to determine a cause for hypertension. These tests include an electrocardiogram (ECG), blood tests, chest x-rays, and 24-hour monitoring of blood pressure to confirm readings. In some cases, studies to visualize the kidneys may be performed.  First, treatment involves diet modification (reducing sodium and alcohol intake), exercise, and weight loss. If hypertension is not under control after 3 to 6 months, medicines may be given to lower blood pressure.  Patients should stop smoking.  Athletes should learn to monitor their own blood pressure. MEDICATIONS  Medicines, such as hydrochlorothiazide, may be given to lower blood pressure, if lifestyle changes are not enough alone.  Blood pressure medicines may affect athletic performance in different ways. Athletes should consider side effects before choosing a hypertension medicine. ACTIVITY  Studies have shown that regular physical activity improves blood pressure control.  Moderate cardiovascular exercise (i.e. brisk walking) may be best for control of hypertension.  Certain high intensity exercises (i.e. weight training) may increase resting blood pressure. Some caregivers may advise athletes to stop strength training until  blood pressure is reduced.  Circuit training has been  shown to be safe for athletes with hypertension. DIET  Athletes should consider a low sodium diet, but should watch for heat cramps with exercise.  Athletes who are overweight should try to reduce weight. SEEK MEDICAL CARE IF:   Medicines are poorly tolerated.  Athletic performance decreases suddenly or does not improve with treatment.  Chest pain, shortness of breath, or heart palpitations occur with exercise. Document Released: 03/11/2005 Document Revised: 06/03/2011 Document Reviewed: 06/23/2008 [facility] Patient Information 2015 [place], [place]. This information is not intended to replace advice given to you by your health care provider. Make sure you discuss any questions you have with your health care provider.

## 2013-10-15 ENCOUNTER — Ambulatory Visit: Payer: BC Managed Care – PPO | Admitting: Internal Medicine

## 2013-11-09 ENCOUNTER — Other Ambulatory Visit: Payer: Self-pay | Admitting: Internal Medicine

## 2013-12-07 ENCOUNTER — Encounter: Payer: BC Managed Care – PPO | Admitting: Internal Medicine

## 2013-12-07 NOTE — Progress Notes (Signed)
   Subjective:    Patient ID: Ruben Welch, male    DOB: 01-05-1976, 38 y.o.   MRN: [id number]  HPI    Review of Systems      Past Medical History  Diagnosis Date  . Chicken pox     Current Outpatient Prescriptions  Medication Sig Dispense Refill  . losartan-hydrochlorothiazide (HYZAAR) 50-12.5 MG per tablet TAKE 1 TABLET BY MOUTH DAILY.  30 tablet  0  . Omega-3 Fatty Acids (FISH OIL) 300 MG CAPS Take 2 capsules by mouth daily.      . Red Yeast Rice Extract (RED YEAST RICE PO) Take 1 capsule by mouth 2 (two) times daily.       No current facility-administered medications for this visit.    No Known Allergies  Family History  Problem Relation Age of Onset  . Hypertension Mother   . Stroke Father   . Hypertension Father   . Kidney disease Maternal Grandmother   . Cancer Neg Hx   . Diabetes Neg Hx     History   Social History  . Marital Status: Married    Spouse Name: N/A    Number of Children: N/A  . Years of Education: N/A   Occupational History  . Not on file.   Social History Main Topics  . Smoking status: Never Smoker   . Smokeless tobacco: Never Used  . Alcohol Use: Yes     Comment: moderate  . Drug Use: No  . Sexual Activity: Not on file   Other Topics Concern  . Not on file   Social History Narrative  . No narrative on file     Constitutional: Denies fever, malaise, fatigue, headache or abrupt weight changes.  HEENT: Denies eye pain, eye redness, ear pain, ringing in the ears, wax buildup, runny nose, nasal congestion, bloody nose, or sore throat. Respiratory: Denies difficulty breathing, shortness of breath, cough or sputum production.   Cardiovascular: Denies chest pain, chest tightness, palpitations or swelling in the hands or feet.  Gastrointestinal: Denies abdominal pain, bloating, constipation, diarrhea or blood in the stool.  GU: Denies urgency, frequency, pain with urination, burning sensation, blood in urine, odor or  discharge. Musculoskeletal: Denies decrease in range of motion, difficulty with gait, muscle pain or joint pain and swelling.  Skin: Denies redness, rashes, lesions or ulcercations.  Neurological: Denies dizziness, difficulty with memory, difficulty with speech or problems with balance and coordination.   No other specific complaints in a complete review of systems (except as listed in HPI above).  Objective:   Physical Exam        Assessment & Plan:

## 2013-12-17 ENCOUNTER — Other Ambulatory Visit: Payer: Self-pay | Admitting: Internal Medicine

## 2014-02-22 ENCOUNTER — Other Ambulatory Visit: Payer: Self-pay

## 2014-02-22 MED ORDER — LOSARTAN POTASSIUM-HCTZ 50-12.5 MG PO TABS: 1.0000 | ORAL_TABLET | Freq: Every day | ORAL | Status: DC

## 2014-03-31 ENCOUNTER — Other Ambulatory Visit: Payer: Self-pay

## 2014-03-31 NOTE — Telephone Encounter (Signed)
Pt request refill losartan HCTZ to [facility] [address] Rx last printed 02/22/14; pt has one more pill; advised pt needed to schedule appt to see Ruben Reaperegina Baity NP. Several appts offered but Pt cannot schedule appt until the end of the month; pt request cb if rx can be filled now? Pt last seen 09/13/13 and was to be seen in one month for BP ck. Pt last had BP checked 2 - 3 months ago BP 150/?Marland Kitchen.Please advise.

## 2014-03-31 NOTE — Telephone Encounter (Signed)
He needs to be seen

## 2014-04-04 NOTE — Telephone Encounter (Signed)
Pt left v/m requesting cb today about losartan HCTZ refill.

## 2014-04-05 ENCOUNTER — Telehealth: Payer: Self-pay | Admitting: Internal Medicine

## 2014-04-05 NOTE — Telephone Encounter (Signed)
If he cannot be seen for a follow up as advised, [facility]

## 2014-04-05 NOTE — Telephone Encounter (Signed)
Ok noted  

## 2014-04-05 NOTE — Telephone Encounter (Signed)
Pt calling back again this morning requesting that you call him back today about needing a refill. I did inform pt of the previous message and that you stated that he would need a office visit. Pt states that he has not been able to take his medication because he is out of refills. Please advise! Thank you!

## 2014-04-05 NOTE — Telephone Encounter (Signed)
I called the pt at the hm/cell # listed and the number is not working, so I call the work # and the pt answered. I again informed the pt that he would have to schedule an appt to be seen. The pt stated he could not schedule til the end of the month, so I offered to make the pt an appt and he declined.

## 2014-04-06 NOTE — Telephone Encounter (Signed)
spok with pt and let him know i would be sending in ONLY 1 mth supply and appt was made in 3 weeks for f/u--Pt advised he will no longer receive refill if he does not keep f/u appt as it is imperative to make sure medication is working--pt expressed understanding and appt was made for 04/26/2014

## 2014-04-06 NOTE — Telephone Encounter (Signed)
Spoke to pt and rx has been sent to pharmacy--pt is aware that he will not receive anymore refills if he does not keep his appt

## 2014-04-08 MED ORDER — LOSARTAN POTASSIUM-HCTZ 50-12.5 MG PO TABS: 1.0000 | ORAL_TABLET | Freq: Every day | ORAL | Status: DC

## 2014-04-08 NOTE — Telephone Encounter (Signed)
Rx sent through e-scribe  

## 2014-04-08 NOTE — Addendum Note (Signed)
Addended by: Roena MaladyEVONTENNO, Venise Ellingwood Y on: 04/08/2014 03:35 PM   Modules accepted: Orders

## 2014-04-08 NOTE — Telephone Encounter (Signed)
Pt left v/m that pharmacy has not received refill yet and pt understood when spoke on 04/06/14 that one refill was to be sent to pharmacy. Pt request cb.

## 2014-04-26 ENCOUNTER — Ambulatory Visit: Payer: Self-pay | Admitting: Internal Medicine

## 2014-04-29 ENCOUNTER — Encounter: Payer: Self-pay | Admitting: Internal Medicine

## 2014-04-29 ENCOUNTER — Ambulatory Visit (INDEPENDENT_AMBULATORY_CARE_PROVIDER_SITE_OTHER): Payer: BLUE CROSS/BLUE SHIELD | Admitting: Internal Medicine

## 2014-04-29 VITALS — BP 142/102 | HR 78 | Temp 98.2°F | Wt 170.5 lb

## 2014-04-29 DIAGNOSIS — I10 Essential (primary) hypertension: Secondary | ICD-10-CM

## 2014-04-29 MED ORDER — LOSARTAN POTASSIUM-HCTZ 50-12.5 MG PO TABS: 1.0000 | ORAL_TABLET | Freq: Every day | ORAL | Status: DC

## 2014-04-29 NOTE — Progress Notes (Signed)
HPI  [name] is a 39 y.o. male presenting to clinic today for f/u for hypertension. He was switched from Lisinopril-HCT to Losartan-HCT 50-12.5 MG at last office visit on 6/15 due to cough. He ran out of Losartan-HCT for two weeks, due to missing follow-ups, and started taking it daily again 2 weeks ago. His blood pressure today is 142/102. He has been trying to improve his lifestyle to help lower his blood pressure by running, limiting red meat and decreasing fat intake.   Review of Systems      Past Medical History  Diagnosis Date  . Chicken pox     Family History  Problem Relation Age of Onset  . Hypertension Mother   . Stroke Father   . Hypertension Father   . Kidney disease Maternal Grandmother   . Cancer Neg Hx   . Diabetes Neg Hx     History   Social History  . Marital Status: Married    Spouse Name: N/A    Number of Children: N/A  . Years of Education: N/A   Occupational History  . Not on file.   Social History Main Topics  . Smoking status: Never Smoker   . Smokeless tobacco: Never Used  . Alcohol Use: Yes     Comment: moderate  . Drug Use: No  . Sexual Activity: Not on file   Other Topics Concern  . Not on file   Social History Narrative   Constitutional: Denies headache, fatigue, fever or abrupt weight changes.  HEENT:  Denies eye pain, eye redness, ear pain, ringing in the ears, wax buildup, runny nose, nasal congestion, bloody nose, or sore throat. Respiratory: Denies cough, difficulty breathing or shortness of breath.  Cardiovascular: Denies chest pain, chest tightness, palpitations or swelling in the hands or feet.   No other specific complaints in a complete review of systems (except as listed in HPI above).  Objective:   BP 142/102 mmHg  Pulse 78  Temp(Src) 98.2 F (36.8 C) (Oral)  Wt 170 lb 8 oz (77.338 kg)  SpO2 99% Wt Readings from Last 3 Encounters:  04/29/14 170 lb 8 oz (77.338 kg)  12/07/13 171 lb (77.565 kg)  09/13/13 175  lb (79.379 kg)   General: Appears his stated age, well developed, well nourished in NAD. HEENT: Head: normal shape and size; Eyes: sclera white, no icterus, conjunctiva pink, PERRLA and EOMs intact; Ears: Tm's gray and intact, normal light reflex; Nose: mucosa pink and moist, septum midline; Throat/Mouth: + PND. Teeth present, mucosa erythematous and moist, no exudate noted, no lesions or ulcerations noted.  Neck: Mild cervical lymphadenopathy. Neck supple, trachea midline. No massses, lumps or thyromegaly present.  Cardiovascular: Normal rate and rhythm. S1,S2 noted.  No murmur, rubs or gallops noted. No JVD or BLE edema. No carotid bruits noted. Pulmonary/Chest: Normal effort and positive vesicular breath sounds. No respiratory distress. No wheezes, rales or ronchi noted.      Assessment & Plan:   HTN:  Although BP is still elevated at 142/102, he wants to make more lifestyle changes for 6 mos with increased exercise and modified nutritional diet. If BP is still elevated at 6 mos f/u, increase Losartan-HCT dosage.  Keep a BP journal: Check BP 1/wk for a month; if diastolic is above 100 then RTC in a month; if diastolic is below 100 then RTC in 3-6 months.

## 2014-04-29 NOTE — Progress Notes (Signed)
Pre visit review using our clinic review tool, if applicable. No additional management support is needed unless otherwise documented below in the visit note. 

## 2014-04-29 NOTE — Patient Instructions (Signed)

## 2014-04-30 DIAGNOSIS — I10 Essential (primary) hypertension: Secondary | ICD-10-CM | POA: Insufficient documentation

## 2014-05-02 ENCOUNTER — Other Ambulatory Visit: Payer: Self-pay | Admitting: Internal Medicine

## 2014-08-19 ENCOUNTER — Encounter: Payer: Self-pay | Admitting: Internal Medicine

## 2014-08-19 ENCOUNTER — Ambulatory Visit (INDEPENDENT_AMBULATORY_CARE_PROVIDER_SITE_OTHER): Payer: BLUE CROSS/BLUE SHIELD | Admitting: Internal Medicine

## 2014-08-19 VITALS — BP 132/84 | HR 70 | Temp 98.4°F | Ht 66.75 in | Wt 173.0 lb

## 2014-08-19 DIAGNOSIS — Z0000 Encounter for general adult medical examination without abnormal findings: Secondary | ICD-10-CM | POA: Diagnosis not present

## 2014-08-19 LAB — COMPREHENSIVE METABOLIC PANEL
ALK PHOS: 38 U/L — AB (ref 39–117)
ALT: 54 U/L — AB (ref 0–53)
AST: 30 U/L (ref 0–37)
Albumin: 4.9 g/dL (ref 3.5–5.2)
BUN: 13 mg/dL (ref 6–23)
CALCIUM: 9.8 mg/dL (ref 8.4–10.5)
CHLORIDE: 95 meq/L — AB (ref 96–112)
CO2: 31 mEq/L (ref 19–32)
CREATININE: 1.27 mg/dL (ref 0.40–1.50)
GFR: 81.88 mL/min (ref >60.00)
GLUCOSE: 90 mg/dL (ref 70–99)
POTASSIUM: 3.9 meq/L (ref 3.5–5.1)
SODIUM: 132 meq/L — AB (ref 135–145)
Total Bilirubin: 0.6 mg/dL (ref 0.2–1.2)
Total Protein: 8.5 g/dL — ABNORMAL HIGH (ref 6.0–8.3)

## 2014-08-19 LAB — LIPID PANEL
Cholesterol: 254 mg/dL — ABNORMAL HIGH (ref 0–200)
HDL: 50.7 mg/dL (ref >39.00)
LDL Cholesterol: 190 mg/dL — ABNORMAL HIGH (ref 0–99)
NonHDL: 203.3
TRIGLYCERIDES: 65 mg/dL (ref 0.0–149.0)
Total CHOL/HDL Ratio: 5
VLDL: 13 mg/dL (ref 0.0–40.0)

## 2014-08-19 LAB — CBC
HEMATOCRIT: 48.3 % (ref 39.0–52.0)
HEMOGLOBIN: 15.9 g/dL (ref 13.0–17.0)
MCHC: 32.8 g/dL (ref 30.0–36.0)
MCV: 83.7 fl (ref 78.0–100.0)
Platelets: 256 10*3/uL (ref 150.0–400.0)
RBC: 5.78 Mil/uL (ref 4.22–5.81)
RDW: 13.9 % (ref 11.5–15.5)
WBC: 7.8 10*3/uL (ref 4.0–10.5)

## 2014-08-19 NOTE — Progress Notes (Signed)
Pre visit review using our clinic review tool, if applicable. No additional management support is needed unless otherwise documented below in the visit note. 

## 2014-08-19 NOTE — Patient Instructions (Signed)

## 2014-08-19 NOTE — Progress Notes (Signed)
Subjective:    Patient ID: Ruben Welch, male    DOB: 1976/02/19, 39 y.o.   MRN: [id number]  HPI  Pt presents to the clinic today for his annual exam. He does have a form that he will need filled out by our employer.  Flu: never Tetanus: 2014 Dentist: biannually  Diet: He tries to consume a low fat diet. Exercise: He reports he runs for about 30 minutes 4 days a week  Review of Systems      Past Medical History  Diagnosis Date  . Chicken pox     Current Outpatient Prescriptions  Medication Sig Dispense Refill  . losartan-hydrochlorothiazide (HYZAAR) 50-12.5 MG per tablet TAKE 1 TABLET BY MOUTH DAILY. 30 tablet 5  . Multiple Vitamins-Minerals (MULTIVITAMIN ADULT) TABS Take 1 packet by mouth daily.    . Omega-3 Fatty Acids (FISH OIL) 300 MG CAPS Take 2 capsules by mouth daily.     No current facility-administered medications for this visit.    Allergies  Allergen Reactions  . Lisinopril Cough    Family History  Problem Relation Age of Onset  . Hypertension Mother   . Stroke Father   . Hypertension Father   . Kidney disease Maternal Grandmother   . Cancer Neg Hx   . Diabetes Neg Hx     History   Social History  . Marital Status: Married    Spouse Name: N/A  . Number of Children: N/A  . Years of Education: N/A   Occupational History  . Not on file.   Social History Main Topics  . Smoking status: Never Smoker   . Smokeless tobacco: Never Used  . Alcohol Use: Yes     Comment: moderate  . Drug Use: No  . Sexual Activity: Not on file   Other Topics Concern  . Not on file   Social History Narrative     Constitutional: Denies fever, malaise, fatigue, headache or abrupt weight changes.  HEENT: Denies eye pain, eye redness, ear pain, ringing in the ears, wax buildup, runny nose, nasal congestion, bloody nose, or sore throat. Respiratory: Denies difficulty breathing, shortness of breath, cough or sputum production.   Cardiovascular: Denies  chest pain, chest tightness, palpitations or swelling in the hands or feet.  Gastrointestinal: Denies abdominal pain, bloating, constipation, diarrhea or blood in the stool.  GU: Denies urgency, frequency, pain with urination, burning sensation, blood in urine, odor or discharge. Musculoskeletal: Denies decrease in range of motion, difficulty with gait, muscle pain or joint pain and swelling.  Skin: Denies redness, rashes, lesions or ulcercations.  Neurological: Denies dizziness, difficulty with memory, difficulty with speech or problems with balance and coordination.  Psych: Denies anxiety, depression, SI/HI.  No other specific complaints in a complete review of systems (except as listed in HPI above).  Objective:   Physical Exam  BP 132/84 mmHg  Pulse 70  Temp(Src) 98.4 F (36.9 C) (Oral)  Ht 5' 6.75" (1.695 m)  Wt 173 lb (78.472 kg)  BMI 27.31 kg/m2  SpO2 98% Wt Readings from Last 3 Encounters:  08/19/14 173 lb (78.472 kg)  04/29/14 170 lb 8 oz (77.338 kg)  12/07/13 171 lb (77.565 kg)    General: Appears his stated age, well developed, well nourished in NAD. Skin: Warm, dry and intact. No rashes, lesions or ulcerations noted. HEENT: Head: normal shape and size; Eyes: sclera white, no icterus, conjunctiva pink, PERRLA and EOMs intact; Ears: Tm's gray and intact, normal light reflex; Throat/Mouth: Teeth present, mucosa pink  and moist, no exudate, lesions or ulcerations noted.  Neck:  Neck supple, trachea midline. No masses, lumps or thyromegaly present.  Cardiovascular: Normal rate and rhythm. S1,S2 noted.  No murmur, rubs or gallops noted. No JVD or BLE edema. No carotid bruits noted. Pulmonary/Chest: Normal effort and positive vesicular breath sounds. No respiratory distress. No wheezes, rales or ronchi noted.  Abdomen: Soft and nontender. Normal bowel sounds, no bruits noted. No distention or masses noted. Liver, spleen and kidneys non palpable. Musculoskeletal: Strength 5/5  BUE/BLE. No signs of joint swelling. No difficulty with gait.  Neurological: Alert and oriented. Cranial nerves grossly II-XII intact. Coordination normal.  Psychiatric: Mood and affect normal. Behavior is normal. Judgment and thought content normal.     BMET    Component Value Date/Time   NA 137 08/03/2013 1411   K 3.9 08/03/2013 1411   CL 101 08/03/2013 1411   CO2 30 08/03/2013 1411   GLUCOSE 82 08/03/2013 1411   BUN 11 08/03/2013 1411   CREATININE 1.2 08/03/2013 1411   CALCIUM 9.3 08/03/2013 1411    Lipid Panel     Component Value Date/Time   CHOL 243* 08/03/2013 1411   TRIG 110.0 08/03/2013 1411   HDL 55.20 08/03/2013 1411   CHOLHDL 4 08/03/2013 1411   VLDL 22.0 08/03/2013 1411   LDLCALC 166* 08/03/2013 1411    CBC    Component Value Date/Time   WBC 7.5 08/03/2013 1411   RBC 5.39 08/03/2013 1411   HGB 14.9 08/03/2013 1411   HCT 45.0 08/03/2013 1411   PLT 228.0 08/03/2013 1411   MCV 83.6 08/03/2013 1411   MCHC 33.1 08/03/2013 1411   RDW 13.8 08/03/2013 1411   LYMPHSABS 1.2 10/31/2008 2205   MONOABS 1.1* 10/31/2008 2205   EOSABS 0.0 10/31/2008 2205   BASOSABS 0.0 10/31/2008 2205    Hgb A1C No results found for: HGBA1C       Assessment & Plan:   Preventative Health Maintenance:  Encouraged him to work on diet and exercise He declines flu shot today All other HM UTD Will check CBC, CMET and Lipid Profile today  RTC in 6 months for follow up HTN

## 2014-08-24 ENCOUNTER — Telehealth: Payer: Self-pay | Admitting: Internal Medicine

## 2014-08-24 NOTE — Telephone Encounter (Signed)
Patient returned Melanie's call. °

## 2014-08-25 ENCOUNTER — Telehealth: Payer: Self-pay | Admitting: Internal Medicine

## 2014-08-25 NOTE — Telephone Encounter (Signed)
Pt called to discuss lab results.

## 2014-12-26 ENCOUNTER — Other Ambulatory Visit: Payer: Self-pay | Admitting: Internal Medicine

## 2015-01-02 ENCOUNTER — Encounter: Payer: Self-pay | Admitting: Internal Medicine

## 2015-01-02 ENCOUNTER — Ambulatory Visit (INDEPENDENT_AMBULATORY_CARE_PROVIDER_SITE_OTHER): Payer: BLUE CROSS/BLUE SHIELD | Admitting: Internal Medicine

## 2015-01-02 VITALS — BP 160/110 | HR 82 | Temp 98.2°F | Wt 183.5 lb

## 2015-01-02 DIAGNOSIS — I10 Essential (primary) hypertension: Secondary | ICD-10-CM

## 2015-01-02 DIAGNOSIS — R0781 Pleurodynia: Secondary | ICD-10-CM

## 2015-01-02 MED ORDER — HYDROCODONE-ACETAMINOPHEN 5-325 MG PO TABS: 1.0000 | ORAL_TABLET | Freq: Three times a day (TID) | ORAL | Status: DC | PRN

## 2015-01-02 NOTE — Progress Notes (Signed)
Subjective:    Patient ID: Ruben Welch, male    DOB: June 24, 1975, 39 y.o.   MRN: [id number]  HPI  Pt presents to the clinic today with c/o right side rib pain. He reports this started 3-4 days ago after he fell down the steps at his home. He describes the pain as achy. It is worse with certain movements. The pain can be more sharp and stabbing with taking a deep breath or coughing. He has noticed some muscle tightness. He did not noticed any bruising in the area. He has taken Aleve daily and ice packs without relief.  Of note, his BP today is 160/100. He reports he has not taken his Hyzaar today. He denies chest pains or shortness of breath.  Review of Systems      Past Medical History  Diagnosis Date  . Chicken pox     Current Outpatient Prescriptions  Medication Sig Dispense Refill  . losartan-hydrochlorothiazide (HYZAAR) 50-12.5 MG tablet TAKE 1 TABLET BY MOUTH DAILY. 30 tablet 2  . Multiple Vitamins-Minerals (MULTIVITAMIN ADULT) TABS Take 1 packet by mouth daily.    . Omega-3 Fatty Acids (FISH OIL) 300 MG CAPS Take 2 capsules by mouth daily.     No current facility-administered medications for this visit.    Allergies  Allergen Reactions  . Lisinopril Cough    Family History  Problem Relation Age of Onset  . Hypertension Mother   . Stroke Father   . Hypertension Father   . Kidney disease Maternal Grandmother   . Cancer Neg Hx   . Diabetes Neg Hx     Social History   Social History  . Marital Status: Married    Spouse Name: N/A  . Number of Children: N/A  . Years of Education: N/A   Occupational History  . Not on file.   Social History Main Topics  . Smoking status: Never Smoker   . Smokeless tobacco: Never Used  . Alcohol Use: 0.0 oz/week    0 Standard drinks or equivalent per week     Comment: moderate  . Drug Use: No  . Sexual Activity: Not on file   Other Topics Concern  . Not on file   Social History Narrative     Constitutional:  Denies fever, malaise, fatigue, headache or abrupt weight changes.  Respiratory: Denies difficulty breathing, shortness of breath, cough or sputum production.   Cardiovascular: Denies chest pain, chest tightness, palpitations or swelling in the hands or feet.  Gastrointestinal: Denies abdominal pain, bloating, constipation, diarrhea or blood in the stool.  GU: Denies urgency, frequency, pain with urination, burning sensation, blood in urine, odor or discharge. Musculoskeletal: Pt reports right side rib pain. Denies decrease in range of motion, difficulty with gait, muscle pain or joint swelling.  Skin: Denies redness, rashes, lesions or ulcercations.   No other specific complaints in a complete review of systems (except as listed in HPI above).  Objective:   Physical Exam   BP 160/110 mmHg  Pulse 82  Temp(Src) 98.2 F (36.8 C) (Oral)  Wt 183 lb 8 oz (83.235 kg)  SpO2 99% Wt Readings from Last 3 Encounters:  01/02/15 183 lb 8 oz (83.235 kg)  08/19/14 173 lb (78.472 kg)  04/29/14 170 lb 8 oz (77.338 kg)    General: Appears his stated age, in NAD. Skin: Warm, dry and intact. No bruising noted. Cardiovascular: Normal rate and rhythm. S1,S2 noted.   Pulmonary/Chest: Normal effort and positive vesicular breath sounds. No respiratory distress.  No wheezes, rales or ronchi noted.  Abdomen: Soft and nontender.  Musculoskeletal: Pain reproduced with palpation of the right lower anterior ribs. Ribs appear stable. Neurological: Alert and oriented.    BMET    Component Value Date/Time   NA 132* 08/19/2014 1507   K 3.9 08/19/2014 1507   CL 95* 08/19/2014 1507   CO2 31 08/19/2014 1507   GLUCOSE 90 08/19/2014 1507   BUN 13 08/19/2014 1507   CREATININE 1.27 08/19/2014 1507   CALCIUM 9.8 08/19/2014 1507    Lipid Panel     Component Value Date/Time   CHOL 254* 08/19/2014 1507   TRIG 65.0 08/19/2014 1507   HDL 50.70 08/19/2014 1507   CHOLHDL 5 08/19/2014 1507   VLDL 13.0 08/19/2014  1507   LDLCALC 190* 08/19/2014 1507    CBC    Component Value Date/Time   WBC 7.8 08/19/2014 1507   RBC 5.78 08/19/2014 1507   HGB 15.9 08/19/2014 1507   HCT 48.3 08/19/2014 1507   PLT 256.0 08/19/2014 1507   MCV 83.7 08/19/2014 1507   MCHC 32.8 08/19/2014 1507   RDW 13.9 08/19/2014 1507   LYMPHSABS 1.2 10/31/2008 2205   MONOABS 1.1* 10/31/2008 2205   EOSABS 0.0 10/31/2008 2205   BASOSABS 0.0 10/31/2008 2205    Hgb A1C No results found for: HGBA1C      Assessment & Plan:   Right side rib pain, s/p fall at home:  Discussed imaging- we both agree that this imaging is not necessary Discussed splinting when coughing Rx for Norco for severe pain Ibuprofen 800 for mild to moderate pain A heating pad may be helpful Avoid any heaving lifting for the next 1-2 weeks Reassurance given that this should resolve with time  HTN:  Advised him of the importance of taking his medications prior to his appt  Return precautions given, RTC as needed or if symptoms persist or worsen

## 2015-01-02 NOTE — Patient Instructions (Signed)

## 2015-01-02 NOTE — Progress Notes (Signed)
Pre visit review using our clinic review tool, if applicable. No additional management support is needed unless otherwise documented below in the visit note. 

## 2015-01-20 ENCOUNTER — Other Ambulatory Visit: Payer: Self-pay | Admitting: Primary Care

## 2015-01-20 ENCOUNTER — Telehealth: Payer: Self-pay | Admitting: Primary Care

## 2015-01-20 DIAGNOSIS — R0789 Other chest pain: Secondary | ICD-10-CM

## 2015-01-20 DIAGNOSIS — R0781 Pleurodynia: Secondary | ICD-10-CM

## 2015-01-20 MED ORDER — HYDROCODONE-ACETAMINOPHEN 5-325 MG PO TABS: 1.0000 | ORAL_TABLET | Freq: Three times a day (TID) | ORAL | Status: DC | PRN

## 2015-01-20 NOTE — Telephone Encounter (Signed)
Received Triage/patient request for refill of hydrocodone-acetaminopehn 5/325 that was recently prescribed for rib pain. Patient treated on 10/10 for fall and provided with 10 days supply. Will refill for an additional 5 days supply for him to use as needed. He may alternate this script with the ibuprofen. Script placed up front for pick up at his convenience.  Thanks.

## 2015-01-20 NOTE — Telephone Encounter (Signed)
Called and notified patient of Kate's comments. Patient verbalized understanding.  

## 2015-02-10 ENCOUNTER — Ambulatory Visit: Payer: BLUE CROSS/BLUE SHIELD | Admitting: Internal Medicine

## 2015-05-11 ENCOUNTER — Other Ambulatory Visit: Payer: Self-pay | Admitting: Internal Medicine

## 2015-07-28 ENCOUNTER — Encounter: Payer: BLUE CROSS/BLUE SHIELD | Admitting: Internal Medicine

## 2015-08-09 ENCOUNTER — Ambulatory Visit: Payer: BLUE CROSS/BLUE SHIELD | Admitting: Internal Medicine

## 2015-08-09 DIAGNOSIS — Z0289 Encounter for other administrative examinations: Secondary | ICD-10-CM

## 2015-08-11 ENCOUNTER — Encounter: Payer: Self-pay | Admitting: Internal Medicine

## 2015-08-11 ENCOUNTER — Ambulatory Visit (INDEPENDENT_AMBULATORY_CARE_PROVIDER_SITE_OTHER): Payer: BLUE CROSS/BLUE SHIELD | Admitting: Internal Medicine

## 2015-08-11 VITALS — BP 142/96 | HR 83 | Temp 98.8°F | Wt 179.0 lb

## 2015-08-11 DIAGNOSIS — Z113 Encounter for screening for infections with a predominantly sexual mode of transmission: Secondary | ICD-10-CM | POA: Diagnosis not present

## 2015-08-11 LAB — HIV ANTIBODY (ROUTINE TESTING W REFLEX): HIV 1&2 Ab, 4th Generation: NONREACTIVE

## 2015-08-11 NOTE — Addendum Note (Signed)
Addended by: Baldomero LamyHAVERS, NATASHA C on: 08/11/2015 03:49 PM   Modules accepted: Orders

## 2015-08-11 NOTE — Progress Notes (Signed)
Pre visit review using our clinic review tool, if applicable. No additional management support is needed unless otherwise documented below in the visit note. 

## 2015-08-11 NOTE — Patient Instructions (Signed)
Sexually Transmitted Disease °A sexually transmitted disease (STD) is a disease or infection that may be passed (transmitted) from person to person, usually during sexual activity. This may happen by way of saliva, semen, blood, vaginal mucus, or urine. Common STDs include: °· Gonorrhea. °· Chlamydia. °· Syphilis. °· HIV and AIDS. °· Genital herpes. °· Hepatitis B and C. °· Trichomonas. °· Human papillomavirus (HPV). °· Pubic lice. °· Scabies. °· Mites. °· Bacterial vaginosis. °WHAT ARE CAUSES OF STDs? °An STD may be caused by bacteria, a virus, or parasites. STDs are often transmitted during sexual activity if one person is infected. However, they may also be transmitted through nonsexual means. STDs may be transmitted after:  °· Sexual intercourse with an infected person. °· Sharing sex toys with an infected person. °· Sharing needles with an infected person or using unclean piercing or tattoo needles. °· Having intimate contact with the genitals, mouth, or rectal areas of an infected person. °· Exposure to infected fluids during birth. °WHAT ARE THE SIGNS AND SYMPTOMS OF STDs? °Different STDs have different symptoms. Some people may not have any symptoms. If symptoms are present, they may include: °· Painful or bloody urination. °· Pain in the pelvis, abdomen, vagina, anus, throat, or eyes. °· A skin rash, itching, or irritation. °· Growths, ulcerations, blisters, or sores in the genital and anal areas. °· Abnormal vaginal discharge with or without bad odor. °· Penile discharge in men. °· Fever. °· Pain or bleeding during sexual intercourse. °· Swollen glands in the groin area. °· Yellow skin and eyes (jaundice). This is seen with hepatitis. °· Swollen testicles. °· Infertility. °· Sores and blisters in the mouth. °HOW ARE STDs DIAGNOSED? °To make a diagnosis, your health care provider may: °· Take a medical history. °· Perform a physical exam. °· Take a sample of any discharge to examine. °· Swab the throat,  cervix, opening to the penis, rectum, or vagina for testing. °· Test a sample of your first morning urine. °· Perform blood tests. °· Perform a Pap test, if this applies. °· Perform a colposcopy. °· Perform a laparoscopy. °HOW ARE STDs TREATED? °Treatment depends on the STD. Some STDs may be treated but not cured. °· Chlamydia, gonorrhea, trichomonas, and syphilis can be cured with antibiotic medicine. °· Genital herpes, hepatitis, and HIV can be treated, but not cured, with prescribed medicines. The medicines lessen symptoms. °· Genital warts from HPV can be treated with medicine or by freezing, burning (electrocautery), or surgery. Warts may come back. °· HPV cannot be cured with medicine or surgery. However, abnormal areas may be removed from the cervix, vagina, or vulva. °· If your diagnosis is confirmed, your recent sexual partners need treatment. This is true even if they are symptom-free or have a negative culture or evaluation. They should not have sex until their health care providers say it is okay. °· Your health care provider may test you for infection again 3 months after treatment. °HOW CAN I REDUCE MY RISK OF GETTING AN STD? °Take these steps to reduce your risk of getting an STD: °· Use latex condoms, dental dams, and water-soluble lubricants during sexual activity. Do not use petroleum jelly or oils. °· Avoid having multiple sex partners. °· Do not have sex with someone who has other sex partners °· Do not have sex with anyone you do not know or who is at high risk for an STD. °· Avoid risky sex practices that can break your skin. °· Do not have sex   if you have open sores on your mouth or skin. °· Avoid drinking too much alcohol or taking illegal drugs. Alcohol and drugs can affect your judgment and put you in a vulnerable position. °· Avoid engaging in oral and anal sex acts. °· Get vaccinated for HPV and hepatitis. If you have not received these vaccines in the past, talk to your health care  provider about whether one or both might be right for you. °· If you are at risk of being infected with HIV, it is recommended that you take a prescription medicine daily to prevent HIV infection. This is called pre-exposure prophylaxis (PrEP). You are considered at risk if: °¨ You are a man who has sex with other men (MSM). °¨ You are a heterosexual man or woman and are sexually active with more than one partner. °¨ You take drugs by injection. °¨ You are sexually active with a partner who has HIV. °· Talk with your health care provider about whether you are at high risk of being infected with HIV. If you choose to begin PrEP, you should first be tested for HIV. You should then be tested every 3 months for as long as you are taking PrEP. °WHAT SHOULD I DO IF I THINK I HAVE AN STD? °· See your health care provider. °· Tell your sexual partner(s). They should be tested and treated for any STDs. °· Do not have sex until your health care provider says it is okay. °WHEN SHOULD I GET IMMEDIATE MEDICAL CARE? °Contact your health care provider right away if:  °· You have severe abdominal pain. °· You are a man and notice swelling or pain in your testicles. °· You are a woman and notice swelling or pain in your vagina. °  °This information is not intended to replace advice given to you by your health care provider. Make sure you discuss any questions you have with your health care provider. °  °Document Released: 06/01/2002 Document Revised: 04/01/2014 Document Reviewed: 09/29/2012 °Elsevier Interactive Patient Education ©2016 [employer] ° °

## 2015-08-11 NOTE — Progress Notes (Signed)
Subjective:    Patient ID: Ruben Welch, male    DOB: 1975/06/30, 40 y.o.   MRN: [id number]  HPI  Pt presents to the clinic today for an STD check. He is sexually active with one partner. He does not use protection. He currently denies dysuria, penile discharge or pain in his testicles.  Review of Systems  Past Medical History  Diagnosis Date  . Chicken pox     Current Outpatient Prescriptions  Medication Sig Dispense Refill  . losartan-hydrochlorothiazide (HYZAAR) 50-12.5 MG tablet TAKE 1 TABLET BY MOUTH DAILY. 30 tablet 3  . Multiple Vitamins-Minerals (MULTIVITAMIN ADULT) TABS Take 1 packet by mouth daily.    . Omega-3 Fatty Acids (FISH OIL) 300 MG CAPS Take 2 capsules by mouth daily.     No current facility-administered medications for this visit.    Allergies  Allergen Reactions  . Lisinopril Cough    Family History  Problem Relation Age of Onset  . Hypertension Mother   . Stroke Father   . Hypertension Father   . Kidney disease Maternal Grandmother   . Cancer Neg Hx   . Diabetes Neg Hx     Social History   Social History  . Marital Status: Married    Spouse Name: N/A  . Number of Children: N/A  . Years of Education: N/A   Occupational History  . Not on file.   Social History Main Topics  . Smoking status: Never Smoker   . Smokeless tobacco: Never Used  . Alcohol Use: 0.0 oz/week    0 Standard drinks or equivalent per week     Comment: moderate  . Drug Use: No  . Sexual Activity: Not on file   Other Topics Concern  . Not on file   Social History Narrative     Constitutional: Denies fever, malaise, fatigue, headache or abrupt weight changes.  Gastrointestinal: Denies abdominal pain, bloating, constipation, diarrhea or blood in the stool.  GU: Denies urgency, frequency, pain with urination, burning sensation, blood in urine, odor or discharge.   No other specific complaints in a complete review of systems (except as listed in HPI  above).     Objective:   Physical Exam   BP 142/96 mmHg  Pulse 83  Temp(Src) 98.8 F (37.1 C) (Oral)  Wt 179 lb (81.194 kg)  SpO2 98% Wt Readings from Last 3 Encounters:  08/11/15 179 lb (81.194 kg)  01/02/15 183 lb 8 oz (83.235 kg)  08/19/14 173 lb (78.472 kg)    General: Appears his stated age, in NAD. Abdomen: Soft and nontender. Normal bowel sounds. No distention or masses noted.    BMET    Component Value Date/Time   NA 132* 08/19/2014 1507   K 3.9 08/19/2014 1507   CL 95* 08/19/2014 1507   CO2 31 08/19/2014 1507   GLUCOSE 90 08/19/2014 1507   BUN 13 08/19/2014 1507   CREATININE 1.27 08/19/2014 1507   CALCIUM 9.8 08/19/2014 1507    Lipid Panel     Component Value Date/Time   CHOL 254* 08/19/2014 1507   TRIG 65.0 08/19/2014 1507   HDL 50.70 08/19/2014 1507   CHOLHDL 5 08/19/2014 1507   VLDL 13.0 08/19/2014 1507   LDLCALC 190* 08/19/2014 1507    CBC    Component Value Date/Time   WBC 7.8 08/19/2014 1507   RBC 5.78 08/19/2014 1507   HGB 15.9 08/19/2014 1507   HCT 48.3 08/19/2014 1507   PLT 256.0 08/19/2014 1507   MCV 83.7  08/19/2014 1507   MCHC 32.8 08/19/2014 1507   RDW 13.9 08/19/2014 1507   LYMPHSABS 1.2 10/31/2008 2205   MONOABS 1.1* 10/31/2008 2205   EOSABS 0.0 10/31/2008 2205   BASOSABS 0.0 10/31/2008 2205    Hgb A1C No results found for: HGBA1C      Assessment & Plan:   Screening for STD:  Urine G/C HIV, HSV, RPR and Hep C ordered  Will follow up after labs, RTC as needed

## 2015-08-12 LAB — HEPATITIS C ANTIBODY: HCV AB: NEGATIVE

## 2015-08-12 LAB — GC/CHLAMYDIA PROBE AMP
CT PROBE, AMP APTIMA: NOT DETECTED
GC PROBE AMP APTIMA: NOT DETECTED

## 2015-08-12 LAB — RPR

## 2015-08-14 LAB — HSV(HERPES SMPLX)ABS-I+II(IGG+IGM)-BLD
HSV 1 Glycoprotein G Ab, IgG: 38.4 Index — ABNORMAL HIGH (ref <0.90)
HSV 2 Glycoprotein G Ab, IgG: <0.9 Index (ref <0.90)
Herpes Simplex Vrs I&II-IgM Ab (EIA): 0.34 INDEX

## 2015-09-13 NOTE — Telephone Encounter (Signed)
Discard prescription for Hydrocodone-acetaminophen (NORCO/VICODIN) 5-325 MG tablet that was prescribed on 01/20/2015. Patient did not pick prescription.

## 2015-10-13 ENCOUNTER — Other Ambulatory Visit: Payer: Self-pay

## 2015-10-13 MED ORDER — LOSARTAN POTASSIUM-HCTZ 50-12.5 MG PO TABS: 1.0000 | ORAL_TABLET | Freq: Every day | ORAL | Status: DC

## 2015-10-13 NOTE — Telephone Encounter (Signed)
Rx sent electronically.  

## 2015-10-30 ENCOUNTER — Telehealth: Payer: Self-pay | Admitting: Internal Medicine

## 2015-10-30 NOTE — Telephone Encounter (Signed)
Pt called about his no show fee he received for 5/17.  He stated he called and r/s this appointment to 5/19.  He stated he he was not told he would get a no show fee when he r/s appointment.  He stated he was advise to r/s appointment because he was going to be a few minutes late.

## 2015-10-31 NOTE — Telephone Encounter (Signed)
Rose, can you help this patient please with getting this written off?  Thanks

## 2015-11-01 NOTE — Telephone Encounter (Signed)
I have taken care of this and notified patient.  Thank you!

## 2015-11-10 ENCOUNTER — Encounter: Payer: BLUE CROSS/BLUE SHIELD | Admitting: Internal Medicine

## 2015-11-16 ENCOUNTER — Other Ambulatory Visit: Payer: Self-pay | Admitting: Internal Medicine

## 2015-11-17 ENCOUNTER — Ambulatory Visit (INDEPENDENT_AMBULATORY_CARE_PROVIDER_SITE_OTHER): Payer: BLUE CROSS/BLUE SHIELD | Admitting: Internal Medicine

## 2015-11-17 ENCOUNTER — Encounter: Payer: Self-pay | Admitting: Internal Medicine

## 2015-11-17 VITALS — BP 136/90 | HR 88 | Temp 98.5°F | Ht 66.75 in | Wt 173.5 lb

## 2015-11-17 DIAGNOSIS — I10 Essential (primary) hypertension: Secondary | ICD-10-CM

## 2015-11-17 DIAGNOSIS — Z0000 Encounter for general adult medical examination without abnormal findings: Secondary | ICD-10-CM

## 2015-11-17 DIAGNOSIS — E785 Hyperlipidemia, unspecified: Secondary | ICD-10-CM | POA: Diagnosis not present

## 2015-11-17 LAB — CBC
HEMATOCRIT: 46.2 % (ref 38.5–50.0)
HEMOGLOBIN: 15.9 g/dL (ref 13.2–17.1)
MCH: 28.1 pg (ref 27.0–33.0)
MCHC: 34.4 g/dL (ref 32.0–36.0)
MCV: 81.8 fL (ref 80.0–100.0)
MPV: 11.5 fL (ref 7.5–12.5)
Platelets: 298 10*3/uL (ref 140–400)
RBC: 5.65 MIL/uL (ref 4.20–5.80)
RDW: 14 % (ref 11.0–15.0)
WBC: 11.4 10*3/uL — ABNORMAL HIGH (ref 3.8–10.8)

## 2015-11-17 NOTE — Progress Notes (Signed)
Subjective:    Patient ID: Ruben Welch, male    DOB: 31-Jul-1975, 41 y.o.   MRN: [id number]  HPI  Pt presents to the clinic today for his annual exam. He is also due to follow up HTN. His BP today is 136/90. He is taking Hyzaar as prescribed. He denies chest pain, chest tightness or shortness of breath.  Flu: never Tetanus: 2014 Dentist: biannually  Diet: He only eats seafood. He consumes fruits and veggies most days of the week. He does consume some fried food. He drinks mostly water or juice. Exercise: He reports he runs for about 30 minutes 4 days a week  Review of Systems      Past Medical History:  Diagnosis Date  . Chicken pox     Current Outpatient Prescriptions  Medication Sig Dispense Refill  . losartan-hydrochlorothiazide (HYZAAR) 50-12.5 MG tablet TAKE 1 TABLET BY MOUTH DAILY. 30 tablet 0   No current facility-administered medications for this visit.     Allergies  Allergen Reactions  . Lisinopril Cough    Family History  Problem Relation Age of Onset  . Hypertension Mother   . Stroke Father   . Hypertension Father   . Kidney disease Maternal Grandmother   . Cancer Neg Hx   . Diabetes Neg Hx     Social History   Social History  . Marital status: Married    Spouse name: N/A  . Number of children: N/A  . Years of education: N/A   Occupational History  . Not on file.   Social History Main Topics  . Smoking status: Never Smoker  . Smokeless tobacco: Never Used  . Alcohol use 0.0 oz/week     Comment: moderate  . Drug use: No  . Sexual activity: Not on file   Other Topics Concern  . Not on file   Social History Narrative  . No narrative on file     Constitutional: Denies fever, malaise, fatigue, headache or abrupt weight changes.  HEENT: Denies eye pain, eye redness, ear pain, ringing in the ears, wax buildup, runny nose, nasal congestion, bloody nose, or sore throat. Respiratory: Denies difficulty breathing, shortness of  breath, cough or sputum production.   Cardiovascular: Denies chest pain, chest tightness, palpitations or swelling in the hands or feet.  Gastrointestinal: Denies abdominal pain, bloating, constipation, diarrhea or blood in the stool.  GU: Denies urgency, frequency, pain with urination, burning sensation, blood in urine, odor or discharge. Musculoskeletal: Denies decrease in range of motion, difficulty with gait, muscle pain or joint pain and swelling.  Skin: Denies redness, rashes, lesions or ulcercations.  Neurological: Denies dizziness, difficulty with memory, difficulty with speech or problems with balance and coordination.  Psych: Denies anxiety, depression, SI/HI.  No other specific complaints in a complete review of systems (except as listed in HPI above).  Objective:   Physical Exam  BP 136/90   Pulse 88   Temp 98.5 F (36.9 C) (Oral)   Ht 5' 6.75" (1.695 m)   Wt 173 lb 8 oz (78.7 kg)   SpO2 98%   BMI 27.38 kg/m   Wt Readings from Last 3 Encounters:  08/11/15 179 lb (81.2 kg)  01/02/15 183 lb 8 oz (83.2 kg)  08/19/14 173 lb (78.5 kg)    General: Appears his stated age, well developed, well nourished in NAD. Skin: Warm, dry and intact.  HEENT: Head: normal shape and size; Eyes: sclera white, no icterus, conjunctiva pink, PERRLA and EOMs intact; Ears: Tm's  gray and intact, normal light reflex; Throat/Mouth: Teeth present, mucosa pink and moist, no exudate, lesions or ulcerations noted.  Neck:  Neck supple, trachea midline. No masses, lumps or thyromegaly present.  Cardiovascular: Normal rate and rhythm. S1,S2 noted.  No murmur, rubs or gallops noted. No JVD or BLE edema.  Pulmonary/Chest: Normal effort and positive vesicular breath sounds. No respiratory distress. No wheezes, rales or ronchi noted.  Abdomen: Soft and nontender. Normal bowel sounds. No distention or masses noted. Liver, spleen and kidneys non palpable. Musculoskeletal: Strength 5/5 BUE/BLE. No signs of joint  swelling. No difficulty with gait.  Neurological: Alert and oriented. Cranial nerves grossly II-XII intact. Coordination normal.  Psychiatric: Mood and affect normal. Behavior is normal. Judgment and thought content normal.     BMET    Component Value Date/Time   NA 132 (L) 08/19/2014 1507   K 3.9 08/19/2014 1507   CL 95 (L) 08/19/2014 1507   CO2 31 08/19/2014 1507   GLUCOSE 90 08/19/2014 1507   BUN 13 08/19/2014 1507   CREATININE 1.27 08/19/2014 1507   CALCIUM 9.8 08/19/2014 1507    Lipid Panel     Component Value Date/Time   CHOL 254 (H) 08/19/2014 1507   TRIG 65.0 08/19/2014 1507   HDL 50.70 08/19/2014 1507   CHOLHDL 5 08/19/2014 1507   VLDL 13.0 08/19/2014 1507   LDLCALC 190 (H) 08/19/2014 1507    CBC    Component Value Date/Time   WBC 7.8 08/19/2014 1507   RBC 5.78 08/19/2014 1507   HGB 15.9 08/19/2014 1507   HCT 48.3 08/19/2014 1507   PLT 256.0 08/19/2014 1507   MCV 83.7 08/19/2014 1507   MCHC 32.8 08/19/2014 1507   RDW 13.9 08/19/2014 1507   LYMPHSABS 1.2 10/31/2008 2205   MONOABS 1.1 (H) 10/31/2008 2205   EOSABS 0.0 10/31/2008 2205   BASOSABS 0.0 10/31/2008 2205    Hgb A1C No results found for: HGBA1C       Assessment & Plan:   Preventative Health Maintenance:  Advised him to get a flu shot in the fall Tetanus UTD Encouraged him to consume a balanced diet and exercise regime Encouraged him to see a dentist annually  Will check CBC, CMET and Lipid Profile today  RTC in 1 year for annual exam Nicki ReaperBAITY, Kelsea Mousel, NP

## 2015-11-17 NOTE — Patient Instructions (Signed)

## 2015-11-17 NOTE — Assessment & Plan Note (Signed)
Controlled on Hyzaar CMET today

## 2015-11-18 LAB — COMPREHENSIVE METABOLIC PANEL
ALBUMIN: 5.2 g/dL — AB (ref 3.6–5.1)
ALK PHOS: 38 U/L — AB (ref 40–115)
ALT: 39 U/L (ref 9–46)
AST: 28 U/L (ref 10–40)
BUN: 18 mg/dL (ref 7–25)
CALCIUM: 10.3 mg/dL (ref 8.6–10.3)
CHLORIDE: 99 mmol/L (ref 98–110)
CO2: 22 mmol/L (ref 20–31)
Creat: 1.59 mg/dL — ABNORMAL HIGH (ref 0.60–1.35)
Glucose, Bld: 77 mg/dL (ref 65–99)
POTASSIUM: 3.9 mmol/L (ref 3.5–5.3)
Sodium: 138 mmol/L (ref 135–146)
Total Bilirubin: 0.7 mg/dL (ref 0.2–1.2)
Total Protein: 8.6 g/dL — ABNORMAL HIGH (ref 6.1–8.1)

## 2015-11-18 LAB — LIPID PANEL
CHOL/HDL RATIO: 3.7 ratio (ref <=5.0)
CHOLESTEROL: 234 mg/dL — AB (ref 125–200)
HDL: 63 mg/dL (ref >=40)
LDL Cholesterol: 156 mg/dL — ABNORMAL HIGH (ref <130)
TRIGLYCERIDES: 73 mg/dL (ref <150)
VLDL: 15 mg/dL (ref <30)

## 2015-11-20 MED ORDER — LOSARTAN POTASSIUM 100 MG PO TABS: 100.0000 mg | ORAL_TABLET | Freq: Every day | ORAL | 3 refills | Status: DC

## 2015-11-20 NOTE — Addendum Note (Signed)
Addended by: Roena MaladyEVONTENNO, Hovanes Hymas Y on: 11/20/2015 04:19 PM   Modules accepted: Orders

## 2015-12-01 NOTE — Addendum Note (Signed)
Addended by: Roena MaladyEVONTENNO, [place] Y on: 12/01/2015 09:29 AM   Modules accepted: Orders

## 2015-12-15 ENCOUNTER — Other Ambulatory Visit (INDEPENDENT_AMBULATORY_CARE_PROVIDER_SITE_OTHER): Payer: BLUE CROSS/BLUE SHIELD

## 2015-12-15 DIAGNOSIS — I10 Essential (primary) hypertension: Secondary | ICD-10-CM

## 2015-12-15 LAB — BASIC METABOLIC PANEL
BUN: 7 mg/dL (ref 6–23)
CHLORIDE: 107 meq/L (ref 96–112)
CO2: 27 meq/L (ref 19–32)
CREATININE: 1.18 mg/dL (ref 0.40–1.50)
Calcium: 9.1 mg/dL (ref 8.4–10.5)
GFR: 88.5 mL/min (ref >60.00)
Glucose, Bld: 107 mg/dL — ABNORMAL HIGH (ref 70–99)
Potassium: 3.9 mEq/L (ref 3.5–5.1)
Sodium: 143 mEq/L (ref 135–145)

## 2016-01-15 ENCOUNTER — Telehealth: Payer: Self-pay | Admitting: *Deleted

## 2016-01-15 NOTE — Telephone Encounter (Signed)
PT brought in form to be filled out. Please complete it and contact patient [phone]. Form placed in Rx tower.

## 2016-01-18 NOTE — Telephone Encounter (Signed)
Placed in your box to sign, please return to me

## 2016-01-19 NOTE — Telephone Encounter (Signed)
Form signed and placed in MYD box 

## 2016-01-19 NOTE — Telephone Encounter (Signed)
Form was faxed to number on form per pt request---will scan in confirmation sheet once confirmed

## 2016-02-19 ENCOUNTER — Other Ambulatory Visit: Payer: BLUE CROSS/BLUE SHIELD

## 2016-04-05 ENCOUNTER — Other Ambulatory Visit: Payer: Self-pay | Admitting: Internal Medicine

## 2016-04-05 DIAGNOSIS — I10 Essential (primary) hypertension: Secondary | ICD-10-CM

## 2016-05-17 ENCOUNTER — Telehealth: Payer: Self-pay

## 2016-05-17 ENCOUNTER — Encounter: Payer: Self-pay | Admitting: Internal Medicine

## 2016-05-17 ENCOUNTER — Ambulatory Visit (INDEPENDENT_AMBULATORY_CARE_PROVIDER_SITE_OTHER): Payer: BLUE CROSS/BLUE SHIELD | Admitting: Internal Medicine

## 2016-05-17 VITALS — BP 188/110 | HR 74 | Temp 98.2°F | Wt 173.5 lb

## 2016-05-17 DIAGNOSIS — I10 Essential (primary) hypertension: Secondary | ICD-10-CM

## 2016-05-17 DIAGNOSIS — E7800 Pure hypercholesterolemia, unspecified: Secondary | ICD-10-CM | POA: Insufficient documentation

## 2016-05-17 LAB — LIPID PANEL
CHOL/HDL RATIO: 3
CHOLESTEROL: 241 mg/dL — AB (ref 0–200)
HDL: 69.9 mg/dL (ref >39.00)
LDL Cholesterol: 140 mg/dL — ABNORMAL HIGH (ref 0–99)
NonHDL: 171.43
TRIGLYCERIDES: 156 mg/dL — AB (ref 0.0–149.0)
VLDL: 31.2 mg/dL (ref 0.0–40.0)

## 2016-05-17 MED ORDER — AMLODIPINE BESY-BENAZEPRIL HCL 10-20 MG PO CAPS: 1.0000 | ORAL_CAPSULE | Freq: Every day | ORAL | 0 refills | Status: DC

## 2016-05-17 NOTE — Assessment & Plan Note (Signed)
CMET and Lipid Profile today Encouraged him to consume a low fat diet If LDL not at goal, will discuss statin therapy again

## 2016-05-17 NOTE — Assessment & Plan Note (Signed)
Uncontrolled Stop Losartan Start Benazepril-Amlodipine 10-20.  Advised him not to take more than requested  RTC in 2 weeks, sooner if needed

## 2016-05-17 NOTE — Patient Instructions (Signed)
Hypertension Hypertension, commonly called high blood pressure, is when the force of blood pumping through your arteries is too strong. Your arteries are the blood vessels that carry blood from your heart throughout your body. A blood pressure reading consists of a higher number over a lower number, such as 110/72. The higher number (systolic) is the pressure inside your arteries when your heart pumps. The lower number (diastolic) is the pressure inside your arteries when your heart relaxes. Ideally you want your blood pressure below 120/80. Hypertension forces your heart to work harder to pump blood. Your arteries may become narrow or stiff. Having untreated or uncontrolled hypertension can cause heart attack, stroke, kidney disease, and other problems. What increases the risk? Some risk factors for high blood pressure are controllable. Others are not. Risk factors you cannot control include:  Race. You may be at higher risk if you are African American.  Age. Risk increases with age.  Gender. Men are at higher risk than women before age 45 years. After age 65, women are at higher risk than men. Risk factors you can control include:  Not getting enough exercise or physical activity.  Being overweight.  Getting too much fat, sugar, calories, or salt in your diet.  Drinking too much alcohol. What are the signs or symptoms? Hypertension does not usually cause signs or symptoms. Extremely high blood pressure (hypertensive crisis) may cause headache, anxiety, shortness of breath, and nosebleed. How is this diagnosed? To check if you have hypertension, your health care provider will measure your blood pressure while you are seated, with your arm held at the level of your heart. It should be measured at least twice using the same arm. Certain conditions can cause a difference in blood pressure between your right and left arms. A blood pressure reading that is higher than normal on one occasion does  not mean that you need treatment. If it is not clear whether you have high blood pressure, you may be asked to return on a different day to have your blood pressure checked again. Or, you may be asked to monitor your blood pressure at home for 1 or more weeks. How is this treated? Treating high blood pressure includes making lifestyle changes and possibly taking medicine. Living a healthy lifestyle can help lower high blood pressure. You may need to change some of your habits. Lifestyle changes may include:  Following the DASH diet. This diet is high in fruits, vegetables, and whole grains. It is low in salt, red meat, and added sugars.  Keep your sodium intake below 2,300 mg per day.  Getting at least 30-45 minutes of aerobic exercise at least 4 times per week.  Losing weight if necessary.  Not smoking.  Limiting alcoholic beverages.  Learning ways to reduce stress. Your health care provider may prescribe medicine if lifestyle changes are not enough to get your blood pressure under control, and if one of the following is true:  You are 18-59 years of age and your systolic blood pressure is above 140.  You are 60 years of age or older, and your systolic blood pressure is above 150.  Your diastolic blood pressure is above 90.  You have diabetes, and your systolic blood pressure is over 140 or your diastolic blood pressure is over 90.  You have kidney disease and your blood pressure is above 140/90.  You have heart disease and your blood pressure is above 140/90. Your personal target blood pressure may vary depending on your medical   conditions, your age, and other factors. Follow these instructions at home:  Have your blood pressure rechecked as directed by your health care provider.  Take medicines only as directed by your health care provider. Follow the directions carefully. Blood pressure medicines must be taken as prescribed. The medicine does not work as well when you skip  doses. Skipping doses also puts you at risk for problems.  Do not smoke.  Monitor your blood pressure at home as directed by your health care provider. Contact a health care provider if:  You think you are having a reaction to medicines taken.  You have recurrent headaches or feel dizzy.  You have swelling in your ankles.  You have trouble with your vision. Get help right away if:  You develop a severe headache or confusion.  You have unusual weakness, numbness, or feel faint.  You have severe chest or abdominal pain.  You vomit repeatedly.  You have trouble breathing. This information is not intended to replace advice given to you by your health care provider. Make sure you discuss any questions you have with your health care provider. Document Released: 03/11/2005 Document Revised: 08/17/2015 Document Reviewed: 01/01/2013 Elsevier Interactive Patient Education  2017 [employer]  

## 2016-05-17 NOTE — Progress Notes (Signed)
Subjective:    Patient ID: Ruben Welch, male    DOB: 10-Dec-1975, 41 y.o.   MRN: [id number]  HPI  Pt presents to the clinic today for elevated blood pressure. His BP today is 188/110. He was switched to Losartan 100 mg daily 11/20/15 after his labs showed elevated creatinine. He has been taking the medication as prescribed. He has been checking it at home over the last 3 weeks, it has been running 190's/100. He has been taking Losartan 2 tabs of his blood pressure medication. He denies blurred vision, dizziness, chest pain or shortness of breath.   He is also due to follow up on his cholesterol. His LDL was 156, 10/2015. He refused cholesterol medication at that time. He has been trying to avoid fried foods. He did not take the Red Yeast Rice as suggested.  Review of Systems      Past Medical History:  Diagnosis Date  . Chicken pox     Current Outpatient Prescriptions  Medication Sig Dispense Refill  . losartan (COZAAR) 100 MG tablet TAKE 1 TABLET (100 MG TOTAL) BY MOUTH DAILY. 30 tablet 6   No current facility-administered medications for this visit.     Allergies  Allergen Reactions  . Lisinopril Cough    Family History  Problem Relation Age of Onset  . Hypertension Mother   . Stroke Father   . Hypertension Father   . Kidney disease Maternal Grandmother   . Cancer Neg Hx   . Diabetes Neg Hx     Social History   Social History  . Marital status: Married    Spouse name: N/A  . Number of children: N/A  . Years of education: N/A   Occupational History  . Not on file.   Social History Main Topics  . Smoking status: Never Smoker  . Smokeless tobacco: Never Used  . Alcohol use 0.0 oz/week     Comment: moderate  . Drug use: No  . Sexual activity: Not on file   Other Topics Concern  . Not on file   Social History Narrative  . No narrative on file     Constitutional: Denies fever, malaise, fatigue, headache or abrupt weight changes.  Respiratory:  Denies difficulty breathing, shortness of breath, cough or sputum production.   Cardiovascular: Denies chest pain, chest tightness, palpitations or swelling in the hands or feet.  Neurological: Denies dizziness, difficulty with memory, difficulty with speech or problems with balance and coordination.   No other specific complaints in a complete review of systems (except as listed in HPI above).  Objective:   Physical Exam  BP (!) 188/110   Pulse 74   Temp 98.2 F (36.8 C) (Oral)   Wt 173 lb 8 oz (78.7 kg)   SpO2 99%   BMI 27.38 kg/m  Wt Readings from Last 3 Encounters:  05/17/16 173 lb 8 oz (78.7 kg)  11/17/15 173 lb 8 oz (78.7 kg)  08/11/15 179 lb (81.2 kg)    General: Appears her stated age, in NAD. Cardiovascular: Normal rate and rhythm. S1,S2 noted.  No murmur, rubs or gallops noted.  Pulmonary/Chest: Normal effort and positive vesicular breath sounds. No respiratory distress. No wheezes, rales or ronchi noted.  Neurological: Alert and oriented.   BMET    Component Value Date/Time   NA 143 12/15/2015 0906   K 3.9 12/15/2015 0906   CL 107 12/15/2015 0906   CO2 27 12/15/2015 0906   GLUCOSE 107 (H) 12/15/2015 0906   BUN  7 12/15/2015 0906   CREATININE 1.18 12/15/2015 0906   CREATININE 1.59 (H) 11/17/2015 1542   CALCIUM 9.1 12/15/2015 0906    Lipid Panel     Component Value Date/Time   CHOL 234 (H) 11/17/2015 1542   TRIG 73 11/17/2015 1542   HDL 63 11/17/2015 1542   CHOLHDL 3.7 11/17/2015 1542   VLDL 15 11/17/2015 1542   LDLCALC 156 (H) 11/17/2015 1542    CBC    Component Value Date/Time   WBC 11.4 (H) 11/17/2015 1542   RBC 5.65 11/17/2015 1542   HGB 15.9 11/17/2015 1542   HCT 46.2 11/17/2015 1542   PLT 298 11/17/2015 1542   MCV 81.8 11/17/2015 1542   MCH 28.1 11/17/2015 1542   MCHC 34.4 11/17/2015 1542   RDW 14.0 11/17/2015 1542   LYMPHSABS 1.2 10/31/2008 2205   MONOABS 1.1 (H) 10/31/2008 2205   EOSABS 0.0 10/31/2008 2205   BASOSABS 0.0 10/31/2008  2205    Hgb A1C No results found for: HGBA1C          Assessment & Plan:

## 2016-05-17 NOTE — Telephone Encounter (Signed)
[facility] called to verify since starting the amlodipine benazepril  The pt was no longer taking losartan. I advised the pt was seen today and pt was advised to stop losartan and start amlodipine benazepril. Pharmacist voiced understanding and nothing further needed,.

## 2016-05-20 ENCOUNTER — Other Ambulatory Visit (INDEPENDENT_AMBULATORY_CARE_PROVIDER_SITE_OTHER): Payer: BLUE CROSS/BLUE SHIELD

## 2016-05-20 DIAGNOSIS — E7800 Pure hypercholesterolemia, unspecified: Secondary | ICD-10-CM | POA: Diagnosis not present

## 2016-05-20 LAB — COMPREHENSIVE METABOLIC PANEL
ALBUMIN: 4.5 g/dL (ref 3.5–5.2)
ALK PHOS: 45 U/L (ref 39–117)
ALT: 56 U/L — ABNORMAL HIGH (ref 0–53)
AST: 32 U/L (ref 0–37)
BUN: 10 mg/dL (ref 6–23)
CALCIUM: 9.2 mg/dL (ref 8.4–10.5)
CHLORIDE: 103 meq/L (ref 96–112)
CO2: 27 mEq/L (ref 19–32)
Creatinine, Ser: 1.22 mg/dL (ref 0.40–1.50)
GFR: 84.97 mL/min (ref >60.00)
Glucose, Bld: 72 mg/dL (ref 70–99)
POTASSIUM: 4.2 meq/L (ref 3.5–5.1)
SODIUM: 140 meq/L (ref 135–145)
TOTAL PROTEIN: 7.7 g/dL (ref 6.0–8.3)
Total Bilirubin: 0.5 mg/dL (ref 0.2–1.2)

## 2016-05-22 ENCOUNTER — Telehealth: Payer: Self-pay

## 2016-05-22 NOTE — Telephone Encounter (Signed)
Pt left v/m; pt has been taking a new med and pt has developed a cough; pt wants to know if he should stop taking the med or be seen before the 2 week f/u/ pt request cb.

## 2016-05-23 ENCOUNTER — Other Ambulatory Visit: Payer: Self-pay | Admitting: Internal Medicine

## 2016-05-23 MED ORDER — AMLODIPINE-VALSARTAN-HCTZ 10-160-25 MG PO TABS: 1.0000 | ORAL_TABLET | Freq: Every day | ORAL | 0 refills | Status: DC

## 2016-05-23 NOTE — Telephone Encounter (Signed)
Pt is aware as instructed 

## 2016-05-23 NOTE — Telephone Encounter (Signed)
Just have him stop it. I will send on Amlodipine-Valsartan

## 2016-05-28 ENCOUNTER — Other Ambulatory Visit: Payer: Self-pay

## 2016-05-28 NOTE — Telephone Encounter (Signed)
Pt left v/m; pt went to [facility] and cost to pt was $ 145.00 which pt thought excessive; pt called ins co and was advised if 90 day rx sent to [facility] mail order cost to pt was $0.00. Pt request med to mail order pharmacy. Pt request cb when refilled.

## 2016-05-29 MED ORDER — AMLODIPINE-VALSARTAN-HCTZ 10-160-25 MG PO TABS: 1.0000 | ORAL_TABLET | Freq: Every day | ORAL | 0 refills | Status: DC

## 2016-05-29 NOTE — Telephone Encounter (Signed)
Pt wants to know the cost of getting # 10 pills of the amlodipine-valsartan-HCTZ at [facility]. [facility] and she said with ins is $49.46 but with a discount card she has it would cost $38.07. Pt request # 10 for $38.07 and Tobi Bastosnna will get ready and [facility]. Pt also notified as instructed from 05/29/16 phone note and pt voiced understanding.

## 2016-05-29 NOTE — Telephone Encounter (Signed)
Rx sent to mail order--I will call pt to let him know it has been sent

## 2016-05-31 ENCOUNTER — Ambulatory Visit: Payer: BLUE CROSS/BLUE SHIELD | Admitting: Internal Medicine

## 2016-06-10 ENCOUNTER — Telehealth: Payer: Self-pay

## 2016-06-10 NOTE — Telephone Encounter (Signed)
Pt said amlodipine-valsartan-HCTZ too expensive and request more affordable med sent to [facility]. Advised pt he should contact his ins co to see what med is most affordable to substitute. The pt said last time he saw Pamala Hurry Baity NP she was saying that the medicine she was giving pt should be affordable on his ins plan. Pt thinks that Pamala Hurry Baity NP knows what coverage he has and request her to pick substitute med.

## 2016-06-11 MED ORDER — AMLODIPINE BESYLATE 10 MG PO TABS: 10.0000 mg | ORAL_TABLET | Freq: Every day | ORAL | 3 refills | Status: DC

## 2016-06-11 MED ORDER — HYDROCHLOROTHIAZIDE 25 MG PO TABS: 25.0000 mg | ORAL_TABLET | Freq: Every day | ORAL | 3 refills | Status: DC

## 2016-06-11 MED ORDER — VALSARTAN 160 MG PO TABS: 160.0000 mg | ORAL_TABLET | Freq: Every day | ORAL | 3 refills | Status: DC

## 2016-06-11 NOTE — Addendum Note (Signed)
Addended by: Roena MaladyEVONTENNO, Janesha Brissette Y on: 06/11/2016 02:02 PM   Modules accepted: Orders

## 2016-06-11 NOTE — Telephone Encounter (Signed)
We can break this down into 3 separate medications and it should be cheaper but would he be willing to take 3 medications?

## 2016-06-11 NOTE — Telephone Encounter (Signed)
Spoke to pt and he is aware that we sometimes do not know if medication is covered until it has been processed--- I let pt know the next step would be to sent in medication in individually for a total of 3 pills... Pt states he may call his insurance company for more information on formulary... I advised pt to let me know if he gets information on coverage... In the meantime I will send in Rx individually

## 2016-06-15 ENCOUNTER — Other Ambulatory Visit: Payer: Self-pay | Admitting: Internal Medicine

## 2016-06-15 DIAGNOSIS — I10 Essential (primary) hypertension: Secondary | ICD-10-CM

## 2016-11-02 ENCOUNTER — Other Ambulatory Visit: Payer: Self-pay | Admitting: Internal Medicine

## 2016-11-06 ENCOUNTER — Telehealth: Payer: Self-pay

## 2016-11-06 MED ORDER — LOSARTAN POTASSIUM 100 MG PO TABS: 100.0000 mg | ORAL_TABLET | Freq: Every day | ORAL | 0 refills | Status: DC

## 2016-11-06 NOTE — Telephone Encounter (Signed)
Recall on Valsartan... Verbal order to send in Losartan 100mg ... Pt needs to schedule office visit to recheck BP..I have been unable to reach this patient by phone.  A letter is being sent to remind pt of his CPE will be due after 11/16/16 and he will need to f/u for more refills on BP meds for a recheck

## 2016-11-13 ENCOUNTER — Emergency Department (HOSPITAL_COMMUNITY)
Admission: EM | Admit: 2016-11-13 | Discharge: 2016-11-13 | Disposition: A | Discharge status: Home / Self Care | Payer: BLUE CROSS/BLUE SHIELD | Attending: Emergency Medicine | Admitting: Emergency Medicine
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
## 2016-11-22 ENCOUNTER — Other Ambulatory Visit: Payer: Self-pay | Admitting: Internal Medicine

## 2016-11-22 NOTE — Telephone Encounter (Signed)
Letter mailed letting pt know he is due for CPE

## 2016-12-31 ENCOUNTER — Other Ambulatory Visit: Payer: Self-pay | Admitting: Internal Medicine

## 2017-01-26 ENCOUNTER — Other Ambulatory Visit: Payer: Self-pay | Admitting: Internal Medicine

## 2017-01-31 ENCOUNTER — Ambulatory Visit (INDEPENDENT_AMBULATORY_CARE_PROVIDER_SITE_OTHER): Payer: BLUE CROSS/BLUE SHIELD | Admitting: Internal Medicine

## 2017-01-31 ENCOUNTER — Encounter: Payer: Self-pay | Admitting: Internal Medicine

## 2017-01-31 VITALS — BP 130/82 | HR 106 | Temp 98.2°F | Ht 66.75 in | Wt 171.0 lb

## 2017-01-31 DIAGNOSIS — E7800 Pure hypercholesterolemia, unspecified: Secondary | ICD-10-CM | POA: Diagnosis not present

## 2017-01-31 DIAGNOSIS — Z0000 Encounter for general adult medical examination without abnormal findings: Secondary | ICD-10-CM

## 2017-01-31 DIAGNOSIS — I10 Essential (primary) hypertension: Secondary | ICD-10-CM | POA: Diagnosis not present

## 2017-01-31 LAB — COMPREHENSIVE METABOLIC PANEL
ALBUMIN: 4.7 g/dL (ref 3.5–5.2)
ALK PHOS: 46 U/L (ref 39–117)
ALT: 62 U/L — ABNORMAL HIGH (ref 0–53)
AST: 31 U/L (ref 0–37)
BUN: 15 mg/dL (ref 6–23)
CO2: 30 mEq/L (ref 19–32)
Calcium: 10.2 mg/dL (ref 8.4–10.5)
Chloride: 98 mEq/L (ref 96–112)
Creatinine, Ser: 1.33 mg/dL (ref 0.40–1.50)
GFR: 76.63 mL/min (ref >60.00)
Glucose, Bld: 102 mg/dL — ABNORMAL HIGH (ref 70–99)
Potassium: 3.4 mEq/L — ABNORMAL LOW (ref 3.5–5.1)
SODIUM: 137 meq/L (ref 135–145)
Total Bilirubin: 0.5 mg/dL (ref 0.2–1.2)
Total Protein: 8.3 g/dL (ref 6.0–8.3)

## 2017-01-31 LAB — LIPID PANEL
CHOLESTEROL: 285 mg/dL — AB (ref 0–200)
HDL: 65.5 mg/dL (ref >39.00)
NonHDL: 219.11
TRIGLYCERIDES: 317 mg/dL — AB (ref 0.0–149.0)
Total CHOL/HDL Ratio: 4
VLDL: 63.4 mg/dL — AB (ref 0.0–40.0)

## 2017-01-31 LAB — CBC
HCT: 47.7 % (ref 39.0–52.0)
Hemoglobin: 15.6 g/dL (ref 13.0–17.0)
MCHC: 32.7 g/dL (ref 30.0–36.0)
MCV: 86.7 fl (ref 78.0–100.0)
Platelets: 300 10*3/uL (ref 150.0–400.0)
RBC: 5.51 Mil/uL (ref 4.22–5.81)
RDW: 13.9 % (ref 11.5–15.5)
WBC: 10.9 10*3/uL — AB (ref 4.0–10.5)

## 2017-01-31 LAB — LDL CHOLESTEROL, DIRECT: LDL DIRECT: 190 mg/dL

## 2017-01-31 NOTE — Assessment & Plan Note (Signed)
CMET and lipid profile today Encouraged him to consume a low fat diet If LDL > 130, will discuss statin therapy with him again, which he had refused in the past

## 2017-01-31 NOTE — Progress Notes (Signed)
Subjective:    Patient ID: Ruben Welch, male    DOB: 1975/05/06, 41 y.o.   MRN: [id number]  HPI  Pt presents to the clinic today for his annual exam. He is also due to follow up chronic conditions.  HTN: His BP today is 130/82. He is taking Amlodipine, Losartan and HCTZ as prescribed. There is no ECG on file.  HLD: His last LDL was 143. He is not taking any cholesterol lowering medication at this time. He does not consume a low fat diet.  Flu: never Tetanus: 04/2012 Dentist: biannually  Diet: He is rarely eating meat. He consumes fruits and veggies. He does eat fried foods. He drinks mostly lemonade. Exercise: He runs the treadmill daily for at least 25 minutes  Review of Systems      Past Medical History:  Diagnosis Date  . Chicken pox     Current Outpatient Medications  Medication Sig Dispense Refill  . amLODipine (NORVASC) 10 MG tablet Take 1 tablet (10 mg total) by mouth daily. MUST SCHEDULE ANNUAL PHYSICAL 30 tablet 1  . amLODipine (NORVASC) 10 MG tablet TAKE 1 TABLET (10 MG TOTAL) BY MOUTH DAILY. MUST SCHEDULE ANNUAL PHYSICAL 30 tablet 0  . hydrochlorothiazide (HYDRODIURIL) 25 MG tablet TAKE 1 TABLET BY MOUTH EVERY DAY 30 tablet 0  . losartan (COZAAR) 100 MG tablet TAKE 1 TABLET BY MOUTH EVERY DAY 90 tablet 0   No current facility-administered medications for this visit.     Allergies  Allergen Reactions  . Lisinopril Cough    Family History  Problem Relation Age of Onset  . Hypertension Mother   . Stroke Father   . Hypertension Father   . Kidney disease Maternal Grandmother   . Cancer Neg Hx   . Diabetes Neg Hx     Social History   Socioeconomic History  . Marital status: Married    Spouse name: Not on file  . Number of children: Not on file  . Years of education: Not on file  . Highest education level: Not on file  Social Needs  . Financial resource strain: Not on file  . Food insecurity - worry: Not on file  . Food insecurity -  inability: Not on file  . Transportation needs - medical: Not on file  . Transportation needs - non-medical: Not on file  Occupational History  . Not on file  Tobacco Use  . Smoking status: Never Smoker  . Smokeless tobacco: Never Used  Substance and Sexual Activity  . Alcohol use: Yes    Alcohol/week: 0.0 oz    Comment: moderate  . Drug use: No  . Sexual activity: Not on file  Other Topics Concern  . Not on file  Social History Narrative  . Not on file     Constitutional: Denies fever, malaise, fatigue, headache or abrupt weight changes.  HEENT: Denies eye pain, eye redness, ear pain, ringing in the ears, wax buildup, runny nose, nasal congestion, bloody nose, or sore throat. Respiratory: Denies difficulty breathing, shortness of breath, cough or sputum production.   Cardiovascular: Denies chest pain, chest tightness, palpitations or swelling in the hands or feet.  Gastrointestinal: Denies abdominal pain, bloating, constipation, diarrhea or blood in the stool.  GU: Denies urgency, frequency, pain with urination, burning sensation, blood in urine, odor or discharge. Musculoskeletal: Denies decrease in range of motion, difficulty with gait, muscle pain or joint pain and swelling.  Skin: Denies redness, rashes, lesions or ulcercations.  Neurological: Denies dizziness, difficulty with  memory, difficulty with speech or problems with balance and coordination.  Psych: Denies anxiety, depression, SI/HI.  No other specific complaints in a complete review of systems (except as listed in HPI above).  Objective:   Physical Exam   BP 130/82   Pulse (!) 106   Temp 98.2 F (36.8 C) (Oral)   Ht 5' 6.75" (1.695 m)   Wt 171 lb (77.6 kg)   SpO2 98%   BMI 26.98 kg/m  Wt Readings from Last 3 Encounters:  01/31/17 171 lb (77.6 kg)  05/17/16 173 lb 8 oz (78.7 kg)  11/17/15 173 lb 8 oz (78.7 kg)    General: Appears his stated age, well developed, well nourished in NAD. Skin: Warm, dry  and intact.  HEENT: Head: normal shape and size; Eyes: sclera white, no icterus, conjunctiva pink, PERRLA and EOMs intact; Ears: Tm's gray and intact, normal light reflex;  Throat/Mouth: Teeth present, mucosa pink and moist, no exudate, lesions or ulcerations noted.  Neck:  Neck supple, trachea midline. No masses, lumps or thyromegaly present.  Cardiovascular: Normal rate and rhythm. S1,S2 noted.  No murmur, rubs or gallops noted. No JVD or BLE edema.  Pulmonary/Chest: Normal effort and positive vesicular breath sounds. No respiratory distress. No wheezes, rales or ronchi noted.  Abdomen: Soft and nontender. Normal bowel sounds. No distention or masses noted. Liver, spleen and kidneys non palpable. Musculoskeletal: Strength 5/5 BUE/BLE. No difficulty with gait.  Neurological: Alert and oriented. Cranial nerves II-XII grossly intact. Coordination normal.  Psychiatric: Mood and affect normal. Behavior is normal. Judgment and thought content normal.    BMET    Component Value Date/Time   NA 140 05/20/2016 1145   K 4.2 05/20/2016 1145   CL 103 05/20/2016 1145   CO2 27 05/20/2016 1145   GLUCOSE 72 05/20/2016 1145   BUN 10 05/20/2016 1145   CREATININE 1.22 05/20/2016 1145   CREATININE 1.59 (H) 11/17/2015 1542   CALCIUM 9.2 05/20/2016 1145    Lipid Panel     Component Value Date/Time   CHOL 241 (H) 05/17/2016 1430   TRIG 156.0 (H) 05/17/2016 1430   HDL 69.90 05/17/2016 1430   CHOLHDL 3 05/17/2016 1430   VLDL 31.2 05/17/2016 1430   LDLCALC 140 (H) 05/17/2016 1430    CBC    Component Value Date/Time   WBC 11.4 (H) 11/17/2015 1542   RBC 5.65 11/17/2015 1542   HGB 15.9 11/17/2015 1542   HCT 46.2 11/17/2015 1542   PLT 298 11/17/2015 1542   MCV 81.8 11/17/2015 1542   MCH 28.1 11/17/2015 1542   MCHC 34.4 11/17/2015 1542   RDW 14.0 11/17/2015 1542   LYMPHSABS 1.2 10/31/2008 2205   MONOABS 1.1 (H) 10/31/2008 2205   EOSABS 0.0 10/31/2008 2205   BASOSABS 0.0 10/31/2008 2205    Hgb  A1C No results found for: HGBA1C         Assessment & Plan:   Preventative Health Maintenance:  He declines flu shot  Tetanus UTD Encouraged him to consume a balanced diet and exercise regimen Advised him to see a dentist annually Will check CBC, CMET and lipid profile today  RTC in 1 year, sooner if needed Nicki ReaperBAITY, Ruben Fant, NP

## 2017-01-31 NOTE — Patient Instructions (Signed)

## 2017-01-31 NOTE — Assessment & Plan Note (Addendum)
Controlled on Amlodipine, Losartan and HCT CBC and CMET today ECG today - non specific ST depression with T wave abnormality, no acute findings Will monitor

## 2017-02-05 MED ORDER — POTASSIUM CHLORIDE ER 10 MEQ PO TBCR: 10.0000 meq | EXTENDED_RELEASE_TABLET | Freq: Every day | ORAL | 2 refills | Status: DC

## 2017-02-05 MED ORDER — LOSARTAN POTASSIUM 100 MG PO TABS: 100.0000 mg | ORAL_TABLET | Freq: Every day | ORAL | 2 refills | Status: DC

## 2017-02-05 MED ORDER — HYDROCHLOROTHIAZIDE 25 MG PO TABS: 25.0000 mg | ORAL_TABLET | Freq: Every day | ORAL | 2 refills | Status: DC

## 2017-02-05 MED ORDER — AMLODIPINE BESYLATE 10 MG PO TABS: 10.0000 mg | ORAL_TABLET | Freq: Every day | ORAL | 2 refills | Status: DC

## 2017-02-05 NOTE — Addendum Note (Signed)
Addended by: Roena MaladyEVONTENNO, Tasheema Perrone Y on: 02/05/2017 11:30 AM   Modules accepted: Orders

## 2017-05-16 ENCOUNTER — Other Ambulatory Visit: Payer: BLUE CROSS/BLUE SHIELD

## 2017-05-19 ENCOUNTER — Telehealth: Payer: Self-pay | Admitting: Internal Medicine

## 2017-05-19 MED ORDER — HYDROCHLOROTHIAZIDE 25 MG PO TABS: 25.0000 mg | ORAL_TABLET | Freq: Every day | ORAL | 0 refills | Status: DC

## 2017-05-19 MED ORDER — AMLODIPINE BESYLATE 10 MG PO TABS: 10.0000 mg | ORAL_TABLET | Freq: Every day | ORAL | 0 refills | Status: DC

## 2017-05-19 MED ORDER — POTASSIUM CHLORIDE ER 10 MEQ PO TBCR: 10.0000 meq | EXTENDED_RELEASE_TABLET | Freq: Every day | ORAL | 0 refills | Status: DC

## 2017-05-19 MED ORDER — LOSARTAN POTASSIUM 100 MG PO TABS: 100.0000 mg | ORAL_TABLET | Freq: Every day | ORAL | 0 refills | Status: DC

## 2017-05-19 NOTE — Telephone Encounter (Signed)
rec'd call from pt.  Reported he was out of town and forgot his medications; requesting a 7 day supply on Amlodipine, Hydrochlorothiazide, Losartan, and Potassium Chloride.

## 2017-05-19 NOTE — Telephone Encounter (Signed)
Copied from CRM [phone]. Topic: Quick Communication - See Telephone Encounter >> May 19, 2017  8:32 AM Eston Mouldavis, Marchelle Rinella B wrote: CRM for notification. See Telephone encounter for:  PT is out of town and forgot his medications and would like a 7 day supply of his meds              amLODipine (NORVASC) 10 MG tablet    hydrochlorothiazide (HYDRODIURIL) 25 MG tablet   losartan (COZAAR) 100 MG tablet   potassium chloride (K-DUR) 10 MEQ tablet    [id number] [place] [place] [facility] [address] [facility] [address] [phone] (Phone) [phone] (Fax)    05/19/17.

## 2017-05-19 NOTE — Telephone Encounter (Signed)
Notified pt. that his medication refills for 7 days [facility] [place], per his request.  Verb. Understanding.

## 2017-05-19 NOTE — Telephone Encounter (Signed)
Pt calling back to check on status of med refills. I explained to pt there is a 72 hour turn around time. He states he needs them immediatly

## 2017-11-18 ENCOUNTER — Telehealth: Payer: Self-pay | Admitting: Internal Medicine

## 2017-11-18 NOTE — Telephone Encounter (Signed)
Not that I am aware of, but he can contact his pharmacist and ask as well.

## 2017-11-18 NOTE — Telephone Encounter (Signed)
Copied from CRM [phone]. Topic: Quick Communication - See Telephone Encounter >> Nov 18, 2017  2:48 PM Terisa Starraylor, Brittany L wrote: CRM for notification. See Telephone encounter for: 11/18/17.  Patient wants to know are any of his medications are related to cancer? Please call patient.

## 2017-11-18 NOTE — Telephone Encounter (Signed)
I spoke with pt; pt has read some articles that high blood medications can cause cancer and pt wants to know if any of the meds he is taking have cancer causing impurities. Pt request cb.

## 2017-11-19 NOTE — Telephone Encounter (Signed)
Pt is aware as instructed and expressed understanding 

## 2018-02-03 ENCOUNTER — Encounter: Payer: Self-pay | Admitting: Internal Medicine

## 2018-02-03 ENCOUNTER — Ambulatory Visit (INDEPENDENT_AMBULATORY_CARE_PROVIDER_SITE_OTHER): Payer: BLUE CROSS/BLUE SHIELD | Admitting: Internal Medicine

## 2018-02-03 VITALS — BP 148/96 | HR 94 | Temp 98.5°F | Wt 175.0 lb

## 2018-02-03 DIAGNOSIS — I10 Essential (primary) hypertension: Secondary | ICD-10-CM

## 2018-02-03 DIAGNOSIS — Z8669 Personal history of other diseases of the nervous system and sense organs: Secondary | ICD-10-CM | POA: Diagnosis not present

## 2018-02-03 DIAGNOSIS — G441 Vascular headache, not elsewhere classified: Secondary | ICD-10-CM

## 2018-02-03 NOTE — Progress Notes (Signed)
Subjective:    Patient ID: Ruben Welch, male    DOB: 1976-03-16, 42 y.o.   MRN: [id number]  HPI  Pt presents to the clinic today with c/o headache. He reports this started 3 days ago. The headache is located on the left side of his head. He describes the pain as pulsating with intermittent sharp, stabbing pains. He denies associated dizziness, visual changes, sensitivity to light or sound, nausea or vomiting. He does have some pain in his teeth on the left side. He denies runny nose, nasal congestion, ear pain, sore throat or cough. He reports he did stop his BP medication 8 months ago. He did restart 3 days ago. He has tried Excedrin with some relief. His BP today is 144/96.  Review of Systems  Past Medical History:  Diagnosis Date  . Chicken pox     Current Outpatient Medications  Medication Sig Dispense Refill  . amLODipine (NORVASC) 10 MG tablet Take 1 tablet (10 mg total) by mouth daily. 7 tablet 0  . hydrochlorothiazide (HYDRODIURIL) 25 MG tablet Take 1 tablet (25 mg total) by mouth daily. 7 tablet 0  . losartan (COZAAR) 100 MG tablet Take 1 tablet (100 mg total) by mouth daily. 7 tablet 0  . potassium chloride (K-DUR) 10 MEQ tablet Take 1 tablet (10 mEq total) by mouth daily. 7 tablet 0   No current facility-administered medications for this visit.     Allergies  Allergen Reactions  . Lisinopril Cough    Family History  Problem Relation Age of Onset  . Hypertension Mother   . Stroke Father   . Hypertension Father   . Kidney disease Maternal Grandmother   . Cancer Neg Hx   . Diabetes Neg Hx     Social History   Socioeconomic History  . Marital status: Married    Spouse name: Not on file  . Number of children: Not on file  . Years of education: Not on file  . Highest education level: Not on file  Occupational History  . Not on file  Social Needs  . Financial resource strain: Not on file  . Food insecurity:    Worry: Not on file    Inability: Not on  file  . Transportation needs:    Medical: Not on file    Non-medical: Not on file  Tobacco Use  . Smoking status: Never Smoker  . Smokeless tobacco: Never Used  Substance and Sexual Activity  . Alcohol use: Yes    Alcohol/week: 0.0 standard drinks    Comment: moderate  . Drug use: No  . Sexual activity: Not on file  Lifestyle  . Physical activity:    Days per week: Not on file    Minutes per session: Not on file  . Stress: Not on file  Relationships  . Social connections:    Talks on phone: Not on file    Gets together: Not on file    Attends religious service: Not on file    Active member of club or organization: Not on file    Attends meetings of clubs or organizations: Not on file    Relationship status: Not on file  . Intimate partner violence:    Fear of current or ex partner: Not on file    Emotionally abused: Not on file    Physically abused: Not on file    Forced sexual activity: Not on file  Other Topics Concern  . Not on file  Social History Narrative  .  Not on file     Constitutional: Pt reports headache. Denies fever, malaise, fatigue,  or abrupt weight changes.  HEENT: Pt reports tooth pain. Denies eye pain, eye redness, ear pain, ringing in the ears, wax buildup, runny nose, nasal congestion, bloody nose, or sore throat. Respiratory: Denies difficulty breathing, shortness of breath, cough or sputum production.   Cardiovascular: Denies chest pain, chest tightness, palpitations or swelling in the hands or feet.  Neurological: Denies dizziness, difficulty with memory, difficulty with speech or problems with balance and coordination.    No other specific complaints in a complete review of systems (except as listed in HPI above).     Objective:   Physical Exam  BP (!) 148/96   Pulse 94   Temp 98.5 F (36.9 C) (Oral)   Wt 175 lb (79.4 kg)   SpO2 98%   BMI 27.61 kg/m  Wt Readings from Last 3 Encounters:  02/03/18 175 lb (79.4 kg)  01/31/17 171 lb  (77.6 kg)  05/17/16 173 lb 8 oz (78.7 kg)    General: Appears his stated age, well developed, well nourished in NAD. HEENT: Head: normal shape and size; Eyes: sclera white, no icterus, conjunctiva pink, PERRLA and EOMs intact;  Cardiovascular: Normal rate and rhythm. S1,S2 noted.  No murmur, rubs or gallops noted. No JVD or BLE edema.  Pulmonary/Chest: Normal effort and positive vesicular breath sounds. No respiratory distress. No wheezes, rales or ronchi noted.  Neurological: Alert and oriented.   BMET    Component Value Date/Time   NA 137 01/31/2017 1437   K 3.4 (L) 01/31/2017 1437   CL 98 01/31/2017 1437   CO2 30 01/31/2017 1437   GLUCOSE 102 (H) 01/31/2017 1437   BUN 15 01/31/2017 1437   CREATININE 1.33 01/31/2017 1437   CREATININE 1.59 (H) 11/17/2015 1542   CALCIUM 10.2 01/31/2017 1437    Lipid Panel     Component Value Date/Time   CHOL 285 (H) 01/31/2017 1437   TRIG 317.0 (H) 01/31/2017 1437   HDL 65.50 01/31/2017 1437   CHOLHDL 4 01/31/2017 1437   VLDL 63.4 (H) 01/31/2017 1437   LDLCALC 140 (H) 05/17/2016 1430    CBC    Component Value Date/Time   WBC 10.9 (H) 01/31/2017 1437   RBC 5.51 01/31/2017 1437   HGB 15.6 01/31/2017 1437   HCT 47.7 01/31/2017 1437   PLT 300.0 01/31/2017 1437   MCV 86.7 01/31/2017 1437   MCH 28.1 11/17/2015 1542   MCHC 32.7 01/31/2017 1437   RDW 13.9 01/31/2017 1437   LYMPHSABS 1.2 10/31/2008 2205   MONOABS 1.1 (H) 10/31/2008 2205   EOSABS 0.0 10/31/2008 2205   BASOSABS 0.0 10/31/2008 2205    Hgb A1C No results found for: HGBA1C          Assessment & Plan:   Headache:  Resolved at this time Will continue to monitor Can take Tylenol 1000 mg or Ibuprofen 600-800 mg for acute headache  Make an appt for your annual exam Nicki Reaperegina , NP

## 2018-02-03 NOTE — Patient Instructions (Signed)

## 2018-02-03 NOTE — Assessment & Plan Note (Signed)
Uncontrolled but just restarted meds Advised him to take meds as prescribed Reinforced DASH diet and exercise for weight loss

## 2018-02-10 ENCOUNTER — Other Ambulatory Visit: Payer: Self-pay | Admitting: Internal Medicine

## 2018-02-11 MED ORDER — HYDROCHLOROTHIAZIDE 25 MG PO TABS: 25.0000 mg | ORAL_TABLET | Freq: Every day | ORAL | 1 refills | Status: DC

## 2018-02-11 MED ORDER — LOSARTAN POTASSIUM 100 MG PO TABS: 100.0000 mg | ORAL_TABLET | Freq: Every day | ORAL | 1 refills | Status: DC

## 2018-02-11 MED ORDER — AMLODIPINE BESYLATE 10 MG PO TABS: 10.0000 mg | ORAL_TABLET | Freq: Every day | ORAL | 1 refills | Status: DC

## 2018-07-10 ENCOUNTER — Other Ambulatory Visit: Payer: Self-pay | Admitting: Internal Medicine

## 2018-07-10 MED ORDER — HYDROCHLOROTHIAZIDE 25 MG PO TABS: 25.0000 mg | ORAL_TABLET | Freq: Every day | ORAL | 0 refills | Status: DC

## 2018-07-10 MED ORDER — AMLODIPINE BESYLATE 10 MG PO TABS: 10.0000 mg | ORAL_TABLET | Freq: Every day | ORAL | 0 refills | Status: DC

## 2018-07-13 ENCOUNTER — Ambulatory Visit (INDEPENDENT_AMBULATORY_CARE_PROVIDER_SITE_OTHER): Payer: BLUE CROSS/BLUE SHIELD | Admitting: Internal Medicine

## 2018-07-13 ENCOUNTER — Encounter: Payer: Self-pay | Admitting: Internal Medicine

## 2018-07-13 VITALS — BP 142/90

## 2018-07-13 DIAGNOSIS — I10 Essential (primary) hypertension: Secondary | ICD-10-CM

## 2018-07-13 DIAGNOSIS — Z0000 Encounter for general adult medical examination without abnormal findings: Secondary | ICD-10-CM | POA: Diagnosis not present

## 2018-07-13 DIAGNOSIS — E7800 Pure hypercholesterolemia, unspecified: Secondary | ICD-10-CM

## 2018-07-13 NOTE — Progress Notes (Signed)
Virtual Visit via Video Note  I connected with Ruben Welch on 07/13/18 at  8:30 AM EDT by a video enabled telemedicine application and verified that I am speaking with the correct person using two identifiers.   I discussed the limitations of evaluation and management by telemedicine and the availability of in person appointments. The patient expressed understanding and agreed to proceed.  Patient Location: Provider Location:  History of Present Illness:  Pt due for his annual exam. He is also due to follow up chronic conditions.  HTN: He BP at home was 142/90, but he reports he has not taken his blood pressure medication this morning yet. He is taking Losartan, Amlodipine and Potassium as prescribed. He denies frequent headaches, dizziness or visual changes. ECG from 01/2017 reviewed.  HLD: His last LDL was 190, triglycerides 317, 01/2018. He refused cholesterol lowering medication and wanted to consume a low fat diet and increase aerobic exercise. He reports he has not changed his diet or exercise regimen.  Flu: never Tetanus: 2014 Vision Screening: every 1-2 years Dentist: biannually  Diet: He does eat meat. He consumes some fruits and veggies.He has been eating fried foods. He drinks mostly water, Lemonade. Exercise: None  Past Medical History:  Diagnosis Date  . Chicken pox     Current Outpatient Medications  Medication Sig Dispense Refill  . amLODipine (NORVASC) 10 MG tablet Take 1 tablet (10 mg total) by mouth daily. 30 tablet 0  . hydrochlorothiazide (HYDRODIURIL) 25 MG tablet Take 1 tablet (25 mg total) by mouth daily. 30 tablet 0  . KLOR-CON M10 10 MEQ tablet TAKE 1 TABLET (10 MEQ TOTAL) DAILY BY MOUTH. 30 tablet 0  . losartan (COZAAR) 100 MG tablet Take 1 tablet (100 mg total) by mouth daily. 30 tablet 1  . potassium chloride (K-DUR) 10 MEQ tablet Take 1 tablet (10 mEq total) by mouth daily. 7 tablet 0   No current facility-administered medications for this  visit.     Allergies  Allergen Reactions  . Lisinopril Cough    Family History  Problem Relation Age of Onset  . Hypertension Mother   . Stroke Father   . Hypertension Father   . Kidney disease Maternal Grandmother   . Cancer Neg Hx   . Diabetes Neg Hx     Social History   Socioeconomic History  . Marital status: Married    Spouse name: Not on file  . Number of children: Not on file  . Years of education: Not on file  . Highest education level: Not on file  Occupational History  . Not on file  Social Needs  . Financial resource strain: Not on file  . Food insecurity:    Worry: Not on file    Inability: Not on file  . Transportation needs:    Medical: Not on file    Non-medical: Not on file  Tobacco Use  . Smoking status: Never Smoker  . Smokeless tobacco: Never Used  Substance and Sexual Activity  . Alcohol use: Yes    Alcohol/week: 0.0 standard drinks    Comment: moderate  . Drug use: No  . Sexual activity: Not on file  Lifestyle  . Physical activity:    Days per week: Not on file    Minutes per session: Not on file  . Stress: Not on file  Relationships  . Social connections:    Talks on phone: Not on file    Gets together: Not on file    Attends  religious service: Not on file    Active member of club or organization: Not on file    Attends meetings of clubs or organizations: Not on file    Relationship status: Not on file  . Intimate partner violence:    Fear of current or ex partner: Not on file    Emotionally abused: Not on file    Physically abused: Not on file    Forced sexual activity: Not on file  Other Topics Concern  . Not on file  Social History Narrative  . Not on file     Constitutional: Denies fever, malaise, fatigue, headache or abrupt weight changes.  HEENT: Denies eye pain, eye redness, ear pain, ringing in the ears, wax buildup, runny nose, nasal congestion, bloody nose, or sore throat. Respiratory: Denies difficulty breathing,  shortness of breath, cough or sputum production.   Cardiovascular: Denies chest pain, chest tightness, palpitations or swelling in the hands or feet.  Gastrointestinal: Denies abdominal pain, bloating, constipation, diarrhea or blood in the stool.  GU: Denies urgency, frequency, pain with urination, burning sensation, blood in urine, odor or discharge. Musculoskeletal: Denies decrease in range of motion, difficulty with gait, muscle pain or joint pain and swelling.  Skin: Denies redness, rashes, lesions or ulcercations.  Neurological: Denies dizziness, difficulty with memory, difficulty with speech or problems with balance and coordination.  Psych: Denies anxiety, depression, SI/HI.  No other specific complaints in a complete review of systems (except as listed in HPI above).  Observations/Objective:  BP (!) 142/90   Wt Readings from Last 3 Encounters:  02/03/18 175 lb (79.4 kg)  01/31/17 171 lb (77.6 kg)  05/17/16 173 lb 8 oz (78.7 kg)    General: Appears his stated age, well developed, well nourished in NAD. Skin: Warm, dry and intact.  Pulmonary/Chest: Normal effort. No respiratory distress.  Musculoskeletal:  No difficulty with gait.  Neurological: Alert and oriented.  Psychiatric: Mood and affect normal. Behavior is normal. Judgment and thought content normal.     BMET    Component Value Date/Time   NA 137 01/31/2017 1437   K 3.4 (L) 01/31/2017 1437   CL 98 01/31/2017 1437   CO2 30 01/31/2017 1437   GLUCOSE 102 (H) 01/31/2017 1437   BUN 15 01/31/2017 1437   CREATININE 1.33 01/31/2017 1437   CREATININE 1.59 (H) 11/17/2015 1542   CALCIUM 10.2 01/31/2017 1437    Lipid Panel     Component Value Date/Time   CHOL 285 (H) 01/31/2017 1437   TRIG 317.0 (H) 01/31/2017 1437   HDL 65.50 01/31/2017 1437   CHOLHDL 4 01/31/2017 1437   VLDL 63.4 (H) 01/31/2017 1437   LDLCALC 140 (H) 05/17/2016 1430    CBC    Component Value Date/Time   WBC 10.9 (H) 01/31/2017 1437    RBC 5.51 01/31/2017 1437   HGB 15.6 01/31/2017 1437   HCT 47.7 01/31/2017 1437   PLT 300.0 01/31/2017 1437   MCV 86.7 01/31/2017 1437   MCH 28.1 11/17/2015 1542   MCHC 32.7 01/31/2017 1437   RDW 13.9 01/31/2017 1437   LYMPHSABS 1.2 10/31/2008 2205   MONOABS 1.1 (H) 10/31/2008 2205   EOSABS 0.0 10/31/2008 2205   BASOSABS 0.0 10/31/2008 2205    Hgb A1C No results found for: HGBA1C     Assessment and Plan:  Preventative Health Maintenance:  He declines flu shot Tetanus UTD Encouraged him to consume a balanced diet and exercise regimen Advised him to see an eye doctor and dentist  annually Will have him set up lab only for CBC, CMET, Lipid profile   Follow Up Instructions:    I discussed the assessment and treatment plan with the patient. The patient was provided an opportunity to ask questions and all were answered. The patient agreed with the plan and demonstrated an understanding of the instructions.   The patient was advised to call back or seek an in-person evaluation if the symptoms worsen or if the condition fails to improve as anticipated.    Nicki Reaper, NP

## 2018-07-13 NOTE — Assessment & Plan Note (Signed)
Will have him set up lab only appt for CMET and Lipid profile If LDL> 130, will revisit statin therapy discussion Encouraged him to consume a low fat diet and increase aerobic exercise

## 2018-07-13 NOTE — Assessment & Plan Note (Signed)
He has not taken his BP meds today Advised him to take these, and check BP in 3 hours, update me via mychart Will have him set up lab only appt for CBC, CMET Reinforced DASH diet and exercise for weight loss

## 2018-07-13 NOTE — Patient Instructions (Signed)

## 2018-07-14 ENCOUNTER — Other Ambulatory Visit: Payer: Self-pay | Admitting: Internal Medicine

## 2018-07-14 DIAGNOSIS — E7800 Pure hypercholesterolemia, unspecified: Secondary | ICD-10-CM

## 2018-07-14 DIAGNOSIS — I10 Essential (primary) hypertension: Secondary | ICD-10-CM

## 2018-07-17 ENCOUNTER — Ambulatory Visit: Payer: BLUE CROSS/BLUE SHIELD

## 2018-07-17 ENCOUNTER — Other Ambulatory Visit: Payer: Self-pay

## 2018-07-17 ENCOUNTER — Other Ambulatory Visit (INDEPENDENT_AMBULATORY_CARE_PROVIDER_SITE_OTHER): Payer: BLUE CROSS/BLUE SHIELD

## 2018-07-17 VITALS — BP 132/90 | HR 86

## 2018-07-17 DIAGNOSIS — I10 Essential (primary) hypertension: Secondary | ICD-10-CM

## 2018-07-17 DIAGNOSIS — E7800 Pure hypercholesterolemia, unspecified: Secondary | ICD-10-CM | POA: Diagnosis not present

## 2018-07-17 LAB — COMPREHENSIVE METABOLIC PANEL
ALT: 78 U/L — ABNORMAL HIGH (ref 0–53)
AST: 34 U/L (ref 0–37)
Albumin: 4.8 g/dL (ref 3.5–5.2)
Alkaline Phosphatase: 43 U/L (ref 39–117)
BUN: 16 mg/dL (ref 6–23)
CO2: 26 mEq/L (ref 19–32)
Calcium: 9.5 mg/dL (ref 8.4–10.5)
Chloride: 99 mEq/L (ref 96–112)
Creatinine, Ser: 1.26 mg/dL (ref 0.40–1.50)
GFR: 76.19 mL/min (ref >60.00)
Glucose, Bld: 99 mg/dL (ref 70–99)
Potassium: 3.5 mEq/L (ref 3.5–5.1)
Sodium: 136 mEq/L (ref 135–145)
Total Bilirubin: 0.6 mg/dL (ref 0.2–1.2)
Total Protein: 8.2 g/dL (ref 6.0–8.3)

## 2018-07-17 LAB — CBC
HCT: 47.2 % (ref 39.0–52.0)
Hemoglobin: 15.7 g/dL (ref 13.0–17.0)
MCHC: 33.2 g/dL (ref 30.0–36.0)
MCV: 84.3 fl (ref 78.0–100.0)
Platelets: 258 10*3/uL (ref 150.0–400.0)
RBC: 5.6 Mil/uL (ref 4.22–5.81)
RDW: 13.6 % (ref 11.5–15.5)
WBC: 8.8 10*3/uL (ref 4.0–10.5)

## 2018-07-17 LAB — LIPID PANEL
Cholesterol: 290 mg/dL — ABNORMAL HIGH (ref 0–200)
HDL: 54.4 mg/dL (ref >39.00)
LDL Cholesterol: 199 mg/dL — ABNORMAL HIGH (ref 0–99)
NonHDL: 236.05
Total CHOL/HDL Ratio: 5
Triglycerides: 187 mg/dL — ABNORMAL HIGH (ref 0.0–149.0)
VLDL: 37.4 mg/dL (ref 0.0–40.0)

## 2018-07-17 LAB — HEMOGLOBIN A1C: Hgb A1c MFr Bld: 6.2 % (ref 4.6–6.5)

## 2018-07-17 NOTE — Progress Notes (Signed)
Pt came for nurse visit for BP check.  BP reg cuff lt arm 132/90 sitting P86 and pulse ox 98%.  Pt has not missed taking Amlodipine 10 mg, losartan 100 mg, HCTZ 25 mg and K 10 meq; pt takes each med one po daily.   No H/A,CP,SOB,dizziness or vision changes.  Pt contact # is [phone] [facility] [place].

## 2018-07-27 ENCOUNTER — Telehealth: Payer: Self-pay | Admitting: Internal Medicine

## 2018-07-27 MED ORDER — HYDROCHLOROTHIAZIDE 25 MG PO TABS: 25.0000 mg | ORAL_TABLET | Freq: Every day | ORAL | 1 refills | Status: DC

## 2018-07-27 MED ORDER — AMLODIPINE BESYLATE 10 MG PO TABS: 10.0000 mg | ORAL_TABLET | Freq: Every day | ORAL | 1 refills | Status: DC

## 2018-07-27 MED ORDER — POTASSIUM CHLORIDE CRYS ER 10 MEQ PO TBCR: EXTENDED_RELEASE_TABLET | ORAL | 1 refills | Status: DC

## 2018-07-27 MED ORDER — LOSARTAN POTASSIUM 100 MG PO TABS: 100.0000 mg | ORAL_TABLET | Freq: Every day | ORAL | 1 refills | Status: DC

## 2018-07-27 NOTE — Telephone Encounter (Signed)
Patient is requesting refills on ALL prescriptions that our office has prescribed him   [facility] [address] [place]  Completely out of medication

## 2018-07-27 NOTE — Telephone Encounter (Signed)
All of his meds were filled 07/10/2018 and he should not be out yet

## 2018-07-29 ENCOUNTER — Telehealth: Payer: Self-pay | Admitting: Internal Medicine

## 2018-07-29 MED ORDER — POTASSIUM CHLORIDE CRYS ER 10 MEQ PO TBCR: EXTENDED_RELEASE_TABLET | ORAL | 1 refills | Status: DC

## 2018-07-29 MED ORDER — HYDROCHLOROTHIAZIDE 25 MG PO TABS: 25.0000 mg | ORAL_TABLET | Freq: Every day | ORAL | 1 refills | Status: DC

## 2018-07-29 MED ORDER — AMLODIPINE BESYLATE 10 MG PO TABS: 10.0000 mg | ORAL_TABLET | Freq: Every day | ORAL | 1 refills | Status: DC

## 2018-07-29 MED ORDER — LOSARTAN POTASSIUM 100 MG PO TABS: 100.0000 mg | ORAL_TABLET | Freq: Every day | ORAL | 1 refills | Status: DC

## 2018-07-29 NOTE — Telephone Encounter (Signed)
Rx sent through e-scribe  

## 2018-07-29 NOTE — Telephone Encounter (Signed)
Patient is needing his all mediations sent to a new pharmacy.   Patient is completely out of medications.   BEST PHONE- [phone]    [id number] - [place], [place] - [address] AT [address]

## 2018-09-22 ENCOUNTER — Other Ambulatory Visit: Payer: Self-pay | Admitting: Internal Medicine

## 2018-09-22 MED ORDER — LOSARTAN POTASSIUM 50 MG PO TABS: 100.0000 mg | ORAL_TABLET | Freq: Every day | ORAL | 3 refills | Status: DC

## 2018-09-22 NOTE — Telephone Encounter (Signed)
plz notify I've sent in 30d supply for 50mg  dose to take 2 a day.

## 2018-09-22 NOTE — Addendum Note (Signed)
Addended by: Ria Bush on: 09/22/2018 04:50 PM   Modules accepted: Orders

## 2018-09-22 NOTE — Telephone Encounter (Signed)
Pt called to request a change losartan. [facility] [address] [facility]  but they do have 25 and 50 in stock. Pt is requesting a new script to be sent in for quantity to accommodate different size. Pt is out of medication, has been out for several days.

## 2018-09-23 NOTE — Telephone Encounter (Signed)
Left message on vm per dpr relaying Dr. G's message and instructions.  

## 2018-09-23 NOTE — Addendum Note (Signed)
Addended by: Lurlean Nanny on: 09/23/2018 03:56 PM   Modules accepted: Orders

## 2018-11-27 ENCOUNTER — Ambulatory Visit: Payer: BLUE CROSS/BLUE SHIELD | Admitting: Internal Medicine

## 2018-12-25 ENCOUNTER — Ambulatory Visit (INDEPENDENT_AMBULATORY_CARE_PROVIDER_SITE_OTHER): Payer: BC Managed Care – PPO | Admitting: Internal Medicine

## 2018-12-25 ENCOUNTER — Other Ambulatory Visit: Payer: Self-pay

## 2018-12-25 ENCOUNTER — Encounter: Payer: Self-pay | Admitting: Internal Medicine

## 2018-12-25 VITALS — BP 154/102 | HR 102 | Temp 98.7°F | Wt 185.0 lb

## 2018-12-25 DIAGNOSIS — E7800 Pure hypercholesterolemia, unspecified: Secondary | ICD-10-CM | POA: Diagnosis not present

## 2018-12-25 DIAGNOSIS — I10 Essential (primary) hypertension: Secondary | ICD-10-CM | POA: Diagnosis not present

## 2018-12-25 DIAGNOSIS — R7303 Prediabetes: Secondary | ICD-10-CM | POA: Diagnosis not present

## 2018-12-25 DIAGNOSIS — R748 Abnormal levels of other serum enzymes: Secondary | ICD-10-CM | POA: Diagnosis not present

## 2018-12-25 NOTE — Progress Notes (Signed)
Subjective:    Patient ID: Ruben Welch, male    DOB: [birth year], 43 y.o.   MRN: [id number]  HPI  Pt presents to the clinic today for 6 month follow up of chronic conditions.  HTN: His BP today is 154/102. He is taking Losartan, HCTZ, Potassium, and Amlodipine as prescribed however, reports he has not taken in the last 2 days. ECG from 01/2017 reviewed.  HLD: His LDL is 199, 06/2018, triglycerides 187, 06/2018. He has refused statin medication in the past. He has been trying to consume a low fat diet.  Prediabetes: His last A1C was 6.2%, 06/2018. He is not monitoring his sugars. He does not take any oral antidiabetic medication. He does not get routine eye exams. He does not check his feet daily.  Review of Systems  Past Medical History:  Diagnosis Date  . Chicken pox     Current Outpatient Medications  Medication Sig Dispense Refill  . amLODipine (NORVASC) 10 MG tablet Take 1 tablet (10 mg total) by mouth daily. 90 tablet 1  . hydrochlorothiazide (HYDRODIURIL) 25 MG tablet Take 1 tablet (25 mg total) by mouth daily. 90 tablet 1  . losartan (COZAAR) 50 MG tablet Take 2 tablets (100 mg total) by mouth daily. 60 tablet 3  . potassium chloride (KLOR-CON M10) 10 MEQ tablet TAKE 1 TABLET (10 MEQ TOTAL) DAILY BY MOUTH. 90 tablet 1   No current facility-administered medications for this visit.     Allergies  Allergen Reactions  . Lisinopril Cough    Family History  Problem Relation Age of Onset  . Hypertension Mother   . Stroke Father   . Hypertension Father   . Kidney disease Maternal Grandmother   . Cancer Neg Hx   . Diabetes Neg Hx     Social History   Socioeconomic History  . Marital status: Married    Spouse name: Not on file  . Number of children: Not on file  . Years of education: Not on file  . Highest education level: Not on file  Occupational History  . Not on file  Social Needs  . Financial resource strain: Not on file  . Food insecurity    Worry:  Not on file    Inability: Not on file  . Transportation needs    Medical: Not on file    Non-medical: Not on file  Tobacco Use  . Smoking status: Never Smoker  . Smokeless tobacco: Never Used  Substance and Sexual Activity  . Alcohol use: Yes    Alcohol/week: 0.0 standard drinks    Comment: moderate  . Drug use: No  . Sexual activity: Not on file  Lifestyle  . Physical activity    Days per week: Not on file    Minutes per session: Not on file  . Stress: Not on file  Relationships  . Social Musician on phone: Not on file    Gets together: Not on file    Attends religious service: Not on file    Active member of club or organization: Not on file    Attends meetings of clubs or organizations: Not on file    Relationship status: Not on file  . Intimate partner violence    Fear of current or ex partner: Not on file    Emotionally abused: Not on file    Physically abused: Not on file    Forced sexual activity: Not on file  Other Topics Concern  . Not on  file  Social History Narrative  . Not on file     Constitutional: Denies fever, malaise, fatigue, headache or abrupt weight changes.  Respiratory: Denies difficulty breathing, shortness of breath, cough or sputum production.   Cardiovascular: Denies chest pain, chest tightness, palpitations or swelling in the hands or feet.  Musculoskeletal: Denies decrease in range of motion, difficulty with gait, muscle pain or joint pain and swelling.  Skin: Denies redness, rashes, lesions or ulcercations.  Neurological: Denies dizziness, difficulty with memory, difficulty with speech or problems with balance and coordination.   No other specific complaints in a complete review of systems (except as listed in HPI above).     Objective:   Physical Exam  BP (!) 154/102   Pulse (!) 102   Temp 98.7 F (37.1 C) (Temporal)   Wt 185 lb (83.9 kg)   SpO2 98%   BMI 29.19 kg/m  Wt Readings from Last 3 Encounters:  12/25/18  185 lb (83.9 kg)  02/03/18 175 lb (79.4 kg)  01/31/17 171 lb (77.6 kg)    General: Appears his stated age, well developed, well nourished in NAD. Skin: Warm, dry and intact. No ulcerations noted. Cardiovascular: Normal rate and rhythm. S1,S2 noted.  No murmur, rubs or gallops noted. No JVD or BLE edema.  Pulmonary/Chest: Normal effort and positive vesicular breath sounds. No respiratory distress. No wheezes, rales or ronchi noted.   Neurological: Alert and oriented.   BMET    Component Value Date/Time   NA 136 07/17/2018 1304   K 3.5 07/17/2018 1304   CL 99 07/17/2018 1304   CO2 26 07/17/2018 1304   GLUCOSE 99 07/17/2018 1304   BUN 16 07/17/2018 1304   CREATININE 1.26 07/17/2018 1304   CREATININE 1.59 (H) 11/17/2015 1542   CALCIUM 9.5 07/17/2018 1304    Lipid Panel     Component Value Date/Time   CHOL 290 (H) 07/17/2018 1304   TRIG 187.0 (H) 07/17/2018 1304   HDL 54.40 07/17/2018 1304   CHOLHDL 5 07/17/2018 1304   VLDL 37.4 07/17/2018 1304   LDLCALC 199 (H) 07/17/2018 1304    CBC    Component Value Date/Time   WBC 8.8 07/17/2018 1304   RBC 5.60 07/17/2018 1304   HGB 15.7 07/17/2018 1304   HCT 47.2 07/17/2018 1304   PLT 258.0 07/17/2018 1304   MCV 84.3 07/17/2018 1304   MCH 28.1 11/17/2015 1542   MCHC 33.2 07/17/2018 1304   RDW 13.6 07/17/2018 1304   LYMPHSABS 1.2 10/31/2008 2205   MONOABS 1.1 (H) 10/31/2008 2205   EOSABS 0.0 10/31/2008 2205   BASOSABS 0.0 10/31/2008 2205    Hgb A1C Lab Results  Component Value Date   HGBA1C 6.2 07/17/2018            Assessment & Plan:

## 2018-12-26 LAB — LIPID PANEL
Cholesterol: 289 mg/dL — ABNORMAL HIGH (ref <200)
HDL: 61 mg/dL (ref >=40)
LDL Cholesterol (Calc): 190 mg/dL (calc) — ABNORMAL HIGH
Non-HDL Cholesterol (Calc): 228 mg/dL (calc) — ABNORMAL HIGH (ref <130)
Total CHOL/HDL Ratio: 4.7 (calc) (ref <5.0)
Triglycerides: 204 mg/dL — ABNORMAL HIGH (ref <150)

## 2018-12-26 LAB — COMPREHENSIVE METABOLIC PANEL
AG Ratio: 1.4 (calc) (ref 1.0–2.5)
ALT: 133 U/L — ABNORMAL HIGH (ref 9–46)
AST: 62 U/L — ABNORMAL HIGH (ref 10–40)
Albumin: 4.6 g/dL (ref 3.6–5.1)
Alkaline phosphatase (APISO): 41 U/L (ref 36–130)
BUN: 16 mg/dL (ref 7–25)
CO2: 25 mmol/L (ref 20–32)
Calcium: 9.5 mg/dL (ref 8.6–10.3)
Chloride: 103 mmol/L (ref 98–110)
Creat: 1.17 mg/dL (ref 0.60–1.35)
Globulin: 3.2 g/dL (calc) (ref 1.9–3.7)
Glucose, Bld: 97 mg/dL (ref 65–99)
Potassium: 4.1 mmol/L (ref 3.5–5.3)
Sodium: 139 mmol/L (ref 135–146)
Total Bilirubin: 0.4 mg/dL (ref 0.2–1.2)
Total Protein: 7.8 g/dL (ref 6.1–8.1)

## 2018-12-26 LAB — HEMOGLOBIN A1C
Hgb A1c MFr Bld: 6.2 % of total Hgb — ABNORMAL HIGH (ref <5.7)
Mean Plasma Glucose: 131 (calc)
eAG (mmol/L): 7.3 (calc)

## 2018-12-26 NOTE — Patient Instructions (Signed)
Carbohydrate Counting for Diabetes Mellitus, Adult  Carbohydrate counting is a method of keeping track of how many carbohydrates you eat. Eating carbohydrates naturally increases the amount of sugar (glucose) in the blood. Counting how many carbohydrates you eat helps keep your blood glucose within normal limits, which helps you manage your diabetes (diabetes mellitus). It is important to know how many carbohydrates you can safely have in each meal. This is different for every person. A diet and nutrition specialist (registered dietitian) can help you make a meal plan and calculate how many carbohydrates you should have at each meal and snack. Carbohydrates are found in the following foods:  Grains, such as breads and cereals.  Dried beans and soy products.  Starchy vegetables, such as potatoes, peas, and corn.  Fruit and fruit juices.  Milk and yogurt.  Sweets and snack foods, such as cake, cookies, candy, chips, and soft drinks. How do I count carbohydrates? There are two ways to count carbohydrates in food. You can use either of the methods or a combination of both. Reading "Nutrition Facts" on packaged food The "Nutrition Facts" list is included on the labels of almost all packaged foods and beverages in the [place] It includes:  The serving size.  Information about nutrients in each serving, including the grams (g) of carbohydrate per serving. To use the "Nutrition Facts":  Decide how many servings you will have.  Multiply the number of servings by the number of carbohydrates per serving.  The resulting number is the total amount of carbohydrates that you will be having. Learning standard serving sizes of other foods When you eat carbohydrate foods that are not packaged or do not include "Nutrition Facts" on the label, you need to measure the servings in order to count the amount of carbohydrates:  Measure the foods that you will eat with a food scale or measuring cup, if needed.   Decide how many standard-size servings you will eat.  Multiply the number of servings by 15. Most carbohydrate-rich foods have about 15 g of carbohydrates per serving. ? For example, if you eat 8 oz (170 g) of strawberries, you will have eaten 2 servings and 30 g of carbohydrates (2 servings x 15 g = 30 g).  For foods that have more than one food mixed, such as soups and casseroles, you must count the carbohydrates in each food that is included. The following list contains standard serving sizes of common carbohydrate-rich foods. Each of these servings has about 15 g of carbohydrates:   hamburger bun or  English muffin.   oz (15 mL) syrup.   oz (14 g) jelly.  1 slice of bread.  1 six-inch tortilla.  3 oz (85 g) cooked rice or pasta.  4 oz (113 g) cooked dried beans.  4 oz (113 g) starchy vegetable, such as peas, corn, or potatoes.  4 oz (113 g) hot cereal.  4 oz (113 g) mashed potatoes or  of a large baked potato.  4 oz (113 g) canned or frozen fruit.  4 oz (120 mL) fruit juice.  4-6 crackers.  6 chicken nuggets.  6 oz (170 g) unsweetened dry cereal.  6 oz (170 g) plain fat-free yogurt or yogurt sweetened with artificial sweeteners.  8 oz (240 mL) milk.  8 oz (170 g) fresh fruit or one small piece of fruit.  24 oz (680 g) popped popcorn. Example of carbohydrate counting Sample meal  3 oz (85 g) chicken breast.  6 oz (170 g)   brown rice.  4 oz (113 g) corn.  8 oz (240 mL) milk.  8 oz (170 g) strawberries with sugar-free whipped topping. Carbohydrate calculation 1. Identify the foods that contain carbohydrates: ? Rice. ? Corn. ? Milk. ? Strawberries. 2. Calculate how many servings you have of each food: ? 2 servings rice. ? 1 serving corn. ? 1 serving milk. ? 1 serving strawberries. 3. Multiply each number of servings by 15 g: ? 2 servings rice x 15 g = 30 g. ? 1 serving corn x 15 g = 15 g. ? 1 serving milk x 15 g = 15 g. ? 1 serving  strawberries x 15 g = 15 g. 4. Add together all of the amounts to find the total grams of carbohydrates eaten: ? 30 g + 15 g + 15 g + 15 g = 75 g of carbohydrates total. Summary  Carbohydrate counting is a method of keeping track of how many carbohydrates you eat.  Eating carbohydrates naturally increases the amount of sugar (glucose) in the blood.  Counting how many carbohydrates you eat helps keep your blood glucose within normal limits, which helps you manage your diabetes.  A diet and nutrition specialist (registered dietitian) can help you make a meal plan and calculate how many carbohydrates you should have at each meal and snack. This information is not intended to replace advice given to you by your health care provider. Make sure you discuss any questions you have with your health care provider. Document Released: 03/11/2005 Document Revised: 10/03/2016 Document Reviewed: 08/23/2015 Elsevier Patient Education  2020 [employer]  

## 2018-12-26 NOTE — Assessment & Plan Note (Signed)
CBC and C met today Advised him of the importance of taking his medication before his appointment for accurate blood pressure measurement Reinforced DASH diet and exercise for weight loss Continue Losartan, HCTZ, Potassium and Amlodipine Will monitor

## 2018-12-26 NOTE — Assessment & Plan Note (Signed)
C met and lipid profile today If LDL and triglycerides remain elevated, will need to start statin therapy Reinforced low-fat diet

## 2018-12-26 NOTE — Assessment & Plan Note (Signed)
A1c today No microalbumin secondary to ARB therapy Encouraged low carb diet and exercise for weight loss He refuses flu and pneumonia vaccine today We will monitor

## 2019-01-08 MED ORDER — AMLODIPINE BESYLATE 10 MG PO TABS: 10.0000 mg | ORAL_TABLET | Freq: Every day | ORAL | 1 refills | Status: DC

## 2019-01-08 MED ORDER — POTASSIUM CHLORIDE CRYS ER 10 MEQ PO TBCR: EXTENDED_RELEASE_TABLET | ORAL | 1 refills | Status: DC

## 2019-01-08 MED ORDER — ATORVASTATIN CALCIUM 10 MG PO TABS: 10.0000 mg | ORAL_TABLET | Freq: Every day | ORAL | 3 refills | Status: DC

## 2019-01-08 MED ORDER — LOSARTAN POTASSIUM 100 MG PO TABS: 100.0000 mg | ORAL_TABLET | Freq: Every day | ORAL | 1 refills | Status: DC

## 2019-01-08 MED ORDER — HYDROCHLOROTHIAZIDE 25 MG PO TABS: 25.0000 mg | ORAL_TABLET | Freq: Every day | ORAL | 1 refills | Status: DC

## 2019-01-08 NOTE — Addendum Note (Signed)
Addended by: Lurlean Nanny on: 01/08/2019 10:49 AM   Modules accepted: Orders

## 2019-01-08 NOTE — Addendum Note (Signed)
Addended by: Jearld Fenton on: 01/08/2019 01:45 PM   Modules accepted: Orders

## 2019-04-16 ENCOUNTER — Other Ambulatory Visit: Payer: Self-pay

## 2019-04-16 ENCOUNTER — Other Ambulatory Visit (INDEPENDENT_AMBULATORY_CARE_PROVIDER_SITE_OTHER): Payer: Self-pay

## 2019-04-16 DIAGNOSIS — I10 Essential (primary) hypertension: Secondary | ICD-10-CM

## 2019-04-16 DIAGNOSIS — E7800 Pure hypercholesterolemia, unspecified: Secondary | ICD-10-CM

## 2019-04-16 LAB — COMPREHENSIVE METABOLIC PANEL
AG Ratio: 1.5 (calc) (ref 1.0–2.5)
ALT: 60 U/L — ABNORMAL HIGH (ref 9–46)
AST: 32 U/L (ref 10–40)
Albumin: 4.7 g/dL (ref 3.6–5.1)
Alkaline phosphatase (APISO): 39 U/L (ref 36–130)
BUN/Creatinine Ratio: 10 (calc) (ref 6–22)
BUN: 16 mg/dL (ref 7–25)
CO2: 28 mmol/L (ref 20–32)
Calcium: 10.3 mg/dL (ref 8.6–10.3)
Chloride: 101 mmol/L (ref 98–110)
Creat: 1.55 mg/dL — ABNORMAL HIGH (ref 0.60–1.35)
Globulin: 3.2 g/dL (calc) (ref 1.9–3.7)
Glucose, Bld: 79 mg/dL (ref 65–99)
Potassium: 4 mmol/L (ref 3.5–5.3)
Sodium: 140 mmol/L (ref 135–146)
Total Bilirubin: 0.5 mg/dL (ref 0.2–1.2)
Total Protein: 7.9 g/dL (ref 6.1–8.1)

## 2019-04-16 LAB — LIPID PANEL
Cholesterol: 161 mg/dL (ref <200)
HDL: 43 mg/dL (ref >=40)
LDL Cholesterol (Calc): 97 mg/dL (calc)
Non-HDL Cholesterol (Calc): 118 mg/dL (calc) (ref <130)
Total CHOL/HDL Ratio: 3.7 (calc) (ref <5.0)
Triglycerides: 115 mg/dL (ref <150)

## 2019-04-16 NOTE — Addendum Note (Signed)
Addended by: Alvina Chou on: 04/16/2019 02:56 PM   Modules accepted: Orders

## 2019-07-16 ENCOUNTER — Other Ambulatory Visit: Payer: Self-pay | Admitting: Internal Medicine

## 2019-09-07 ENCOUNTER — Other Ambulatory Visit: Payer: Self-pay | Admitting: Internal Medicine

## 2019-10-27 ENCOUNTER — Other Ambulatory Visit: Payer: Self-pay | Admitting: Internal Medicine

## 2019-10-28 ENCOUNTER — Other Ambulatory Visit: Payer: Self-pay | Admitting: Internal Medicine

## 2019-12-10 ENCOUNTER — Other Ambulatory Visit: Payer: Self-pay | Admitting: Internal Medicine

## 2019-12-28 ENCOUNTER — Other Ambulatory Visit: Payer: Self-pay | Admitting: Internal Medicine

## 2020-01-10 ENCOUNTER — Telehealth: Payer: Self-pay | Admitting: Internal Medicine

## 2020-01-12 NOTE — Telephone Encounter (Signed)
Amlodipine was last filled 12/2018 with 1 refill.... should have been out long ago... Have not received a refill request for this medication, other medications were refilled 12/30/2019 with a note saying it would be the last refill as pt is overdue for CPE will send in 1 30 day refill... PATIENT MUST SCHEDULE PHYSICAL for more refills

## 2020-01-12 NOTE — Telephone Encounter (Signed)
Pt called checking on his rx.  Pt is out of med  He is  also needs refill  Atorvastatin  Pt is also out of this med  He is wanting to know if any of his medications be recalled   Please call pt regarding rx recalls  Best number [phone]

## 2020-01-18 MED ORDER — ATORVASTATIN CALCIUM 10 MG PO TABS
10.0000 mg | ORAL_TABLET | Freq: Every day | ORAL | 2 refills | Status: DC
Start: 2020-01-18 — End: 2020-06-29

## 2020-01-18 MED ORDER — POTASSIUM CHLORIDE CRYS ER 10 MEQ PO TBCR: EXTENDED_RELEASE_TABLET | ORAL | 2 refills | Status: DC

## 2020-01-18 MED ORDER — LOSARTAN POTASSIUM 100 MG PO TABS
100.0000 mg | ORAL_TABLET | Freq: Every day | ORAL | 2 refills | Status: DC
Start: 2020-01-18 — End: 2020-05-18

## 2020-01-18 MED ORDER — HYDROCHLOROTHIAZIDE 25 MG PO TABS
25.0000 mg | ORAL_TABLET | Freq: Every day | ORAL | 2 refills | Status: DC
Start: 2020-01-18 — End: 2020-07-04

## 2020-01-18 MED ORDER — AMLODIPINE BESYLATE 10 MG PO TABS
10.0000 mg | ORAL_TABLET | Freq: Every day | ORAL | 2 refills | Status: DC
Start: 2020-01-18 — End: 2020-06-29

## 2020-01-18 NOTE — Addendum Note (Signed)
Addended by: Roena Malady on: 01/18/2020 05:05 PM   Modules accepted: Orders

## 2020-01-18 NOTE — Telephone Encounter (Signed)
Can leave message on  [phone]  Pt schedule 1/20 cpx   He is also wanting to know if any of his medication is on recall

## 2020-04-13 ENCOUNTER — Encounter: Payer: Self-pay | Admitting: Internal Medicine

## 2020-05-18 ENCOUNTER — Other Ambulatory Visit: Payer: Self-pay

## 2020-05-18 MED ORDER — LOSARTAN POTASSIUM 50 MG PO TABS: 100.0000 mg | ORAL_TABLET | Freq: Every day | ORAL | 0 refills | Status: DC

## 2020-06-09 ENCOUNTER — Ambulatory Visit (INDEPENDENT_AMBULATORY_CARE_PROVIDER_SITE_OTHER): Payer: BC Managed Care – PPO | Admitting: Internal Medicine

## 2020-06-09 ENCOUNTER — Encounter: Payer: Self-pay | Admitting: Internal Medicine

## 2020-06-09 ENCOUNTER — Other Ambulatory Visit: Payer: Self-pay

## 2020-06-09 VITALS — BP 138/82 | HR 82 | Temp 98.2°F | Ht 66.75 in | Wt 183.0 lb

## 2020-06-09 DIAGNOSIS — I10 Essential (primary) hypertension: Secondary | ICD-10-CM | POA: Diagnosis not present

## 2020-06-09 DIAGNOSIS — Z0001 Encounter for general adult medical examination with abnormal findings: Secondary | ICD-10-CM | POA: Diagnosis not present

## 2020-06-09 DIAGNOSIS — N522 Drug-induced erectile dysfunction: Secondary | ICD-10-CM

## 2020-06-09 DIAGNOSIS — E7800 Pure hypercholesterolemia, unspecified: Secondary | ICD-10-CM

## 2020-06-09 DIAGNOSIS — R7303 Prediabetes: Secondary | ICD-10-CM | POA: Diagnosis not present

## 2020-06-09 MED ORDER — SILDENAFIL CITRATE 25 MG PO TABS: 25.0000 mg | ORAL_TABLET | Freq: Every day | ORAL | 5 refills | Status: DC | PRN

## 2020-06-09 NOTE — Assessment & Plan Note (Signed)
A1c today Encouraged him to consume a low carb diet and exercise for weight loss We will monitor

## 2020-06-09 NOTE — Assessment & Plan Note (Signed)
C met and lipid profile today Encouraged him to consume a low-fat diet Continue Atorvastatin, Niacin and Fish Oil, refilled today We will monitor

## 2020-06-09 NOTE — Patient Instructions (Signed)

## 2020-06-09 NOTE — Assessment & Plan Note (Signed)
We will trial Sildenafil 25 to 50 mg p.o. every 1 hour prior to sexual

## 2020-06-09 NOTE — Progress Notes (Signed)
Subjective:    Patient ID: Ruben Welch, male    DOB: Dec 17, 1975, 45 y.o.   MRN: [id number]  HPI  Patient presents the clinic today for his annual exam.  He is also due to follow-up chronic conditions.  HTN: His BP today is 138/82.  He is taking Amlodipine, HCTZ, Losartan and Potassium as prescribed.  ECG from 01/2017 reviewed.  HLD: His last LDL was 97, triglycerides were 161, 03/2019.  He denies myalgias on Atorvastatin.  He is taking Niacin and Fish Oil OTC as well.  He tries to consume a low-fat diet.  Prediabetes: His last A1c was 6.2, 12/2018.  He is not taking any oral diabetic medication at this time.  He does not check his sugars.  ED: He has difficulty obtaining an erection and reports his erections are not used to be.  He has never taken anything for erectile dysfunction in the past.  Flu: Never Tetanus: 04/2012 Covid: Never Vision screening: As needed Dentist: Biannually  Diet: He rarely eats meats. He consumes more veggies than fruits. He tries to avoid fried foods. He drinks mostly sweet tea, water, juice. Exercise: Walking and running  Review of Systems      Past Medical History:  Diagnosis Date  . Chicken pox     Current Outpatient Medications  Medication Sig Dispense Refill  . amLODipine (NORVASC) 10 MG tablet Take 1 tablet (10 mg total) by mouth daily. 30 tablet 2  . atorvastatin (LIPITOR) 10 MG tablet Take 1 tablet (10 mg total) by mouth daily. 30 tablet 2  . Cholecalciferol (VITAMIN D3) 250 MCG (10000 UT) TABS Take by mouth.    . Ginkgo Biloba 120 MG CAPS Take by mouth.    . hydrochlorothiazide (HYDRODIURIL) 25 MG tablet Take 1 tablet (25 mg total) by mouth daily. 30 tablet 2  . losartan (COZAAR) 50 MG tablet Take 2 tablets (100 mg total) by mouth daily. 60 tablet 0  . Lutein 40 MG CAPS Take by mouth.    . milk thistle 175 MG tablet Take 175 mg by mouth daily.    . niacin 250 MG tablet Take 250 mg by mouth at bedtime.    . Omega-3 Fatty Acids  (FISH OIL) 1200 MG CAPS Take by mouth.    . potassium chloride (KLOR-CON M10) 10 MEQ tablet TAKE 1 TABLET (10 MEQ TOTAL) DAILY BY MOUTH. 30 tablet 2  . Turmeric (QC TUMERIC COMPLEX PO) Take by mouth.     No current facility-administered medications for this visit.    Allergies  Allergen Reactions  . Lisinopril Cough    Family History  Problem Relation Age of Onset  . Hypertension Mother   . Stroke Father   . Hypertension Father   . Kidney disease Maternal Grandmother   . Cancer Neg Hx   . Diabetes Neg Hx     Social History   Socioeconomic History  . Marital status: Married    Spouse name: Not on file  . Number of children: Not on file  . Years of education: Not on file  . Highest education level: Not on file  Occupational History  . Not on file  Tobacco Use  . Smoking status: Never Smoker  . Smokeless tobacco: Never Used  Substance and Sexual Activity  . Alcohol use: Yes    Alcohol/week: 0.0 standard drinks    Comment: moderate  . Drug use: No  . Sexual activity: Not on file  Other Topics Concern  . Not on file  Social History Narrative  . Not on file   Social Determinants of Health   Financial Resource Strain: Not on file  Food Insecurity: Not on file  Transportation Needs: Not on file  Physical Activity: Not on file  Stress: Not on file  Social Connections: Not on file  Intimate Partner Violence: Not on file     Constitutional: Denies fever, malaise, fatigue, headache or abrupt weight changes.  HEENT: Denies eye pain, eye redness, ear pain, ringing in the ears, wax buildup, runny nose, nasal congestion, bloody nose, or sore throat. Respiratory: Denies difficulty breathing, shortness of breath, cough or sputum production.   Cardiovascular: Denies chest pain, chest tightness, palpitations or swelling in the hands or feet.  Gastrointestinal: Denies abdominal pain, bloating, constipation, diarrhea or blood in the stool.  GU: Pt reports erectile  dysfunction. Denies urgency, frequency, pain with urination, burning sensation, blood in urine, odor or discharge. Musculoskeletal: Denies decrease in range of motion, difficulty with gait, muscle pain or joint pain and swelling.  Skin: Denies redness, rashes, lesions or ulcercations.  Neurological: Denies dizziness, difficulty with memory, difficulty with speech or problems with balance and coordination.  Psych: Denies anxiety, depression, SI/HI.  No other specific complaints in a complete review of systems (except as listed in HPI above).  Objective:   Physical Exam   BP 138/82   Pulse 82   Temp 98.2 F (36.8 C) (Temporal)   Ht 5' 6.75" (1.695 m)   Wt 183 lb (83 kg)   SpO2 98%   BMI 28.88 kg/m   Wt Readings from Last 3 Encounters:  12/25/18 185 lb (83.9 kg)  02/03/18 175 lb (79.4 kg)  01/31/17 171 lb (77.6 kg)    General: Appears his stated age, well developed, well nourished in NAD. Skin: Warm, dry and intact. No rashesnoted. HEENT: Head: normal shape and size; Eyes: sclera white, no icterus, conjunctiva pink, PERRLA and EOMs intact;  Neck:  Neck supple, trachea midline. No masses, lumps or thyromegaly present.  Cardiovascular: Normal rate and rhythm. S1,S2 noted.  No murmur, rubs or gallops noted. No JVD or BLE edema. Pulmonary/Chest: Normal effort and positive vesicular breath sounds. No respiratory distress. No wheezes, rales or ronchi noted.  Abdomen: Soft and nontender. Normal bowel sounds. Ventral hernia noted. Liver, spleen and kidneys non palpable. Musculoskeletal: Strength 5/5 BUE/BLE. No difficulty with gait.  Neurological: Alert and oriented. Cranial nerves II-XII grossly intact. Coordination normal.  Psychiatric: Mood and affect normal. Behavior is normal. Judgment and thought content normal.    BMET    Component Value Date/Time   NA 140 04/16/2019 1458   K 4.0 04/16/2019 1458   CL 101 04/16/2019 1458   CO2 28 04/16/2019 1458   GLUCOSE 79 04/16/2019 1458    BUN 16 04/16/2019 1458   CREATININE 1.55 (H) 04/16/2019 1458   CALCIUM 10.3 04/16/2019 1458    Lipid Panel     Component Value Date/Time   CHOL 161 04/16/2019 1458   TRIG 115 04/16/2019 1458   HDL 43 04/16/2019 1458   CHOLHDL 3.7 04/16/2019 1458   VLDL 37.4 07/17/2018 1304   LDLCALC 97 04/16/2019 1458    CBC    Component Value Date/Time   WBC 8.8 07/17/2018 1304   RBC 5.60 07/17/2018 1304   HGB 15.7 07/17/2018 1304   HCT 47.2 07/17/2018 1304   PLT 258.0 07/17/2018 1304   MCV 84.3 07/17/2018 1304   MCH 28.1 11/17/2015 1542   MCHC 33.2 07/17/2018 1304   RDW  13.6 07/17/2018 1304   LYMPHSABS 1.2 10/31/2008 2205   MONOABS 1.1 (H) 10/31/2008 2205   EOSABS 0.0 10/31/2008 2205   BASOSABS 0.0 10/31/2008 2205    Hgb A1C Lab Results  Component Value Date   HGBA1C 6.2 (H) 12/25/2018           Assessment & Plan:   Preventative health maintenance:  He declines flu shot Tetanus UTD Encouraged him to get a Covid vaccine Encouraged him to consume a balanced diet and exercise regimen Advised him to see an eye doctor and dentist annually We will check CBC, c-Met, lipid, A1c today  RTC in 6 months, follow-up chronic conditions Webb Silversmith, NP This visit occurred during the SARS-CoV-2 public health emergency.  Safety protocols were in place, including screening questions prior to the visit, additional usage of staff PPE, and extensive cleaning of exam room while observing appropriate contact time as indicated for disinfecting solutions.

## 2020-06-09 NOTE — Assessment & Plan Note (Signed)
Controlled on Amlodipine, HCTZ, Losartan and Potassium, medications refilled today Reinforced DASH diet and exercise for weight loss C met today

## 2020-06-10 LAB — HEMOGLOBIN A1C
Hgb A1c MFr Bld: 6.1 % of total Hgb — ABNORMAL HIGH (ref <5.7)
Mean Plasma Glucose: 128 mg/dL
eAG (mmol/L): 7.1 mmol/L

## 2020-06-10 LAB — COMPREHENSIVE METABOLIC PANEL
AG Ratio: 1.6 (calc) (ref 1.0–2.5)
ALT: 103 U/L — ABNORMAL HIGH (ref 9–46)
AST: 44 U/L — ABNORMAL HIGH (ref 10–40)
Albumin: 5.1 g/dL (ref 3.6–5.1)
Alkaline phosphatase (APISO): 49 U/L (ref 36–130)
BUN/Creatinine Ratio: 12 (calc) (ref 6–22)
BUN: 19 mg/dL (ref 7–25)
CO2: 24 mmol/L (ref 20–32)
Calcium: 10.3 mg/dL (ref 8.6–10.3)
Chloride: 101 mmol/L (ref 98–110)
Creat: 1.55 mg/dL — ABNORMAL HIGH (ref 0.60–1.35)
Globulin: 3.2 g/dL (calc) (ref 1.9–3.7)
Glucose, Bld: 99 mg/dL (ref 65–99)
Potassium: 4.1 mmol/L (ref 3.5–5.3)
Sodium: 138 mmol/L (ref 135–146)
Total Bilirubin: 0.6 mg/dL (ref 0.2–1.2)
Total Protein: 8.3 g/dL — ABNORMAL HIGH (ref 6.1–8.1)

## 2020-06-10 LAB — CBC
HCT: 43 % (ref 38.5–50.0)
Hemoglobin: 14.9 g/dL (ref 13.2–17.1)
MCH: 28.5 pg (ref 27.0–33.0)
MCHC: 34.7 g/dL (ref 32.0–36.0)
MCV: 82.4 fL (ref 80.0–100.0)
MPV: 11.7 fL (ref 7.5–12.5)
Platelets: 306 10*3/uL (ref 140–400)
RBC: 5.22 10*6/uL (ref 4.20–5.80)
RDW: 14.4 % (ref 11.0–15.0)
WBC: 9.9 10*3/uL (ref 3.8–10.8)

## 2020-06-10 LAB — LIPID PANEL
Cholesterol: 196 mg/dL (ref <200)
HDL: 48 mg/dL (ref >=40)
LDL Cholesterol (Calc): 127 mg/dL (calc) — ABNORMAL HIGH
Non-HDL Cholesterol (Calc): 148 mg/dL (calc) — ABNORMAL HIGH (ref <130)
Total CHOL/HDL Ratio: 4.1 (calc) (ref <5.0)
Triglycerides: 100 mg/dL (ref <150)

## 2020-06-15 ENCOUNTER — Encounter: Payer: Self-pay | Admitting: Internal Medicine

## 2020-06-29 ENCOUNTER — Other Ambulatory Visit: Payer: Self-pay | Admitting: Internal Medicine

## 2020-07-03 ENCOUNTER — Ambulatory Visit: Payer: BC Managed Care – PPO | Admitting: Internal Medicine

## 2020-07-03 ENCOUNTER — Other Ambulatory Visit: Payer: Self-pay | Admitting: Internal Medicine

## 2020-07-03 DIAGNOSIS — Z0289 Encounter for other administrative examinations: Secondary | ICD-10-CM

## 2020-07-03 NOTE — Progress Notes (Deleted)
Subjective:    Patient ID: Ruben Welch, male    DOB: [birth year], 45 y.o.   MRN: [id number]  HPI  Patient presents to clinic for 2-week follow-up of hypertension.  At his last visit, his HCTZ was stopped secondary to decreased kidney function.  His creatinine was 1.55, no GFR was obtained.  He continued to take Amlodipine, Losartan and Potassium.  His BP today is.  ECG from 01/2017 reviewed.  Review of Systems  Past Medical History:  Diagnosis Date  . Chicken pox     Current Outpatient Medications  Medication Sig Dispense Refill  . amLODipine (NORVASC) 10 MG tablet TAKE 1 TABLET BY MOUTH EVERY DAY 90 tablet 1  . atorvastatin (LIPITOR) 10 MG tablet TAKE 1 TABLET BY MOUTH EVERY DAY 90 tablet 1  . Cholecalciferol (VITAMIN D3) 250 MCG (10000 UT) TABS Take by mouth.    . Ginkgo Biloba 120 MG CAPS Take by mouth.    . hydrochlorothiazide (HYDRODIURIL) 25 MG tablet Take 1 tablet (25 mg total) by mouth daily. 30 tablet 2  . losartan (COZAAR) 50 MG tablet TAKE 2 TABLETS BY MOUTH EVERY DAY 180 tablet 1  . Lutein 40 MG CAPS Take by mouth.    . milk thistle 175 MG tablet Take 175 mg by mouth daily.    . niacin 250 MG tablet Take 250 mg by mouth at bedtime.    . Omega-3 Fatty Acids (FISH OIL) 1200 MG CAPS Take by mouth.    . potassium chloride (KLOR-CON M10) 10 MEQ tablet TAKE 1 TABLET (10 MEQ TOTAL) DAILY BY MOUTH. 30 tablet 2  . sildenafil (VIAGRA) 25 MG tablet Take 1-2 tablets (25-50 mg total) by mouth daily as needed for erectile dysfunction. 10 tablet 5  . Turmeric (QC TUMERIC COMPLEX PO) Take by mouth.     No current facility-administered medications for this visit.    Allergies  Allergen Reactions  . Lisinopril Cough    Family History  Problem Relation Age of Onset  . Hypertension Mother   . Stroke Father   . Hypertension Father   . Kidney disease Maternal Grandmother   . Cancer Neg Hx   . Diabetes Neg Hx     Social History   Socioeconomic History  . Marital  status: Married    Spouse name: Not on file  . Number of children: Not on file  . Years of education: Not on file  . Highest education level: Not on file  Occupational History  . Not on file  Tobacco Use  . Smoking status: Never Smoker  . Smokeless tobacco: Never Used  Substance and Sexual Activity  . Alcohol use: Yes    Alcohol/week: 0.0 standard drinks    Comment: moderate  . Drug use: No  . Sexual activity: Not on file  Other Topics Concern  . Not on file  Social History Narrative  . Not on file   Social Determinants of Health   Financial Resource Strain: Not on file  Food Insecurity: Not on file  Transportation Needs: Not on file  Physical Activity: Not on file  Stress: Not on file  Social Connections: Not on file  Intimate Partner Violence: Not on file     Constitutional: Denies fever, malaise, fatigue, headache or abrupt weight changes.  HEENT: Denies eye pain, eye redness, ear pain, ringing in the ears, wax buildup, runny nose, nasal congestion, bloody nose, or sore throat. Respiratory: Denies difficulty breathing, shortness of breath, cough or sputum production.  Cardiovascular: Denies chest pain, chest tightness, palpitations or swelling in the hands or feet.  Gastrointestinal: Denies abdominal pain, bloating, constipation, diarrhea or blood in the stool.  GU: Denies urgency, frequency, pain with urination, burning sensation, blood in urine, odor or discharge. Musculoskeletal: Denies decrease in range of motion, difficulty with gait, muscle pain or joint pain and swelling.  Skin: Denies redness, rashes, lesions or ulcercations.  Neurological: Denies dizziness, difficulty with memory, difficulty with speech or problems with balance and coordination.  Psych: Denies anxiety, depression, SI/HI.  No other specific complaints in a complete review of systems (except as listed in HPI above).     Objective:   Physical Exam   There were no vitals taken for this  visit. Wt Readings from Last 3 Encounters:  06/09/20 183 lb (83 kg)  12/25/18 185 lb (83.9 kg)  02/03/18 175 lb (79.4 kg)    General: Appears their stated age, well developed, well nourished in NAD. Skin: Warm, dry and intact. No rashes, lesions or ulcerations noted. HEENT: Head: normal shape and size; Eyes: sclera white, no icterus, conjunctiva pink, PERRLA and EOMs intact; Ears: Tm's gray and intact, normal light reflex; Nose: mucosa pink and moist, septum midline; Throat/Mouth: Teeth present, mucosa pink and moist, no exudate, lesions or ulcerations noted.  Neck:  Neck supple, trachea midline. No masses, lumps or thyromegaly present.  Cardiovascular: Normal rate and rhythm. S1,S2 noted.  No murmur, rubs or gallops noted. No JVD or BLE edema. No carotid bruits noted. Pulmonary/Chest: Normal effort and positive vesicular breath sounds. No respiratory distress. No wheezes, rales or ronchi noted.  Abdomen: Soft and nontender. Normal bowel sounds. No distention or masses noted. Liver, spleen and kidneys non palpable. Musculoskeletal: Normal range of motion. No signs of joint swelling. No difficulty with gait.  Neurological: Alert and oriented. Cranial nerves II-XII grossly intact. Coordination normal.  Psychiatric: Mood and affect normal. Behavior is normal. Judgment and thought content normal.   EKG:  BMET    Component Value Date/Time   NA 138 06/09/2020 1455   K 4.1 06/09/2020 1455   CL 101 06/09/2020 1455   CO2 24 06/09/2020 1455   GLUCOSE 99 06/09/2020 1455   BUN 19 06/09/2020 1455   CREATININE 1.55 (H) 06/09/2020 1455   CALCIUM 10.3 06/09/2020 1455    Lipid Panel     Component Value Date/Time   CHOL 196 06/09/2020 1455   TRIG 100 06/09/2020 1455   HDL 48 06/09/2020 1455   CHOLHDL 4.1 06/09/2020 1455   VLDL 37.4 07/17/2018 1304   LDLCALC 127 (H) 06/09/2020 1455    CBC    Component Value Date/Time   WBC 9.9 06/09/2020 1455   RBC 5.22 06/09/2020 1455   HGB 14.9  06/09/2020 1455   HCT 43.0 06/09/2020 1455   PLT 306 06/09/2020 1455   MCV 82.4 06/09/2020 1455   MCH 28.5 06/09/2020 1455   MCHC 34.7 06/09/2020 1455   RDW 14.4 06/09/2020 1455   LYMPHSABS 1.2 10/31/2008 2205   MONOABS 1.1 (H) 10/31/2008 2205   EOSABS 0.0 10/31/2008 2205   BASOSABS 0.0 10/31/2008 2205    Hgb A1C Lab Results  Component Value Date   HGBA1C 6.1 (H) 06/09/2020           Assessment & Plan:     Nicki Reaper, NP This visit occurred during the SARS-CoV-2 public health emergency.  Safety protocols were in place, including screening questions prior to the visit, additional usage of staff PPE, and extensive cleaning  of exam room while observing appropriate contact time as indicated for disinfecting solutions.

## 2021-01-27 ENCOUNTER — Other Ambulatory Visit: Payer: Self-pay | Admitting: Internal Medicine

## 2021-01-27 NOTE — Telephone Encounter (Signed)
   Notes to clinic pt not associated with this practice.  

## 2021-02-06 NOTE — Telephone Encounter (Signed)
Refill request klor-con Last refill 07/04/20 #30/2 Last office visit 06/09/20 No upcoming appointment scheduled

## 2021-06-01 ENCOUNTER — Other Ambulatory Visit: Payer: Self-pay | Admitting: Internal Medicine

## 2021-06-01 NOTE — Telephone Encounter (Signed)
Requested medication (s) are due for refill today:   Yes for all 3 ? ?Requested medication (s) are on the active medication list:   yes for all 3 ? ?Future visit scheduled:   No ? ? ?Last ordered: 06/29/2020  90 day supply with 1 refill for all 3  ? ?Returned because no protocol information attached.  Not sure if pt is with [facility] or with [facility].    ? ?Requested Prescriptions  ?Pending Prescriptions Disp Refills  ? amLODipine (NORVASC) 10 MG tablet [Pharmacy Med Name: AMLODIPINE BESYLATE 10 MG TAB] 30 tablet 5  ?  Sig: TAKE 1 TABLET BY MOUTH EVERY DAY  ?  ? There is no refill protocol information for this order  ?  ? atorvastatin (LIPITOR) 10 MG tablet [Pharmacy Med Name: ATORVASTATIN 10 MG TABLET] 30 tablet 5  ?  Sig: TAKE 1 TABLET BY MOUTH EVERY DAY  ?  ? There is no refill protocol information for this order  ?  ? losartan (COZAAR) 50 MG tablet [Pharmacy Med Name: LOSARTAN POTASSIUM 50 MG TAB] 60 tablet 5  ?  Sig: TAKE 2 TABLETS BY MOUTH EVERY DAY  ?  ? There is no refill protocol information for this order  ?  ? ?

## 2021-07-11 ENCOUNTER — Other Ambulatory Visit: Payer: Self-pay | Admitting: Internal Medicine

## 2021-07-11 NOTE — Telephone Encounter (Signed)
Requested medication (s) are due for refill today: yes ? ?Requested medication (s) are on the active medication list: yes ? ?Last refill:  06/29/20. 90/1 refill ? ?Future visit scheduled: no ? ?Notes to clinic:  Unable to refill per protocol, appointment needed. No protocol attached. ? ? ?  ?Requested Prescriptions  ?Pending Prescriptions Disp Refills  ? amLODipine (NORVASC) 10 MG tablet [Pharmacy Med Name: AMLODIPINE BESYLATE 10 MG TAB] 30 tablet 5  ?  Sig: TAKE 1 TABLET BY MOUTH EVERY DAY  ?  ? There is no refill protocol information for this order  ?  ? atorvastatin (LIPITOR) 10 MG tablet [Pharmacy Med Name: ATORVASTATIN 10 MG TABLET] 30 tablet 5  ?  Sig: TAKE 1 TABLET BY MOUTH EVERY DAY  ?  ? There is no refill protocol information for this order  ?  ? losartan (COZAAR) 50 MG tablet [Pharmacy Med Name: LOSARTAN POTASSIUM 50 MG TAB] 60 tablet 5  ?  Sig: TAKE 2 TABLETS BY MOUTH EVERY DAY  ?  ? There is no refill protocol information for this order  ?  ? ? ?

## 2021-07-30 ENCOUNTER — Ambulatory Visit (INDEPENDENT_AMBULATORY_CARE_PROVIDER_SITE_OTHER): Payer: BC Managed Care – PPO | Admitting: Nurse Practitioner

## 2021-07-30 ENCOUNTER — Encounter: Payer: Self-pay | Admitting: Nurse Practitioner

## 2021-07-30 VITALS — BP 176/108 | HR 97 | Temp 97.2°F | Resp 14 | Ht 66.75 in | Wt 182.1 lb

## 2021-07-30 DIAGNOSIS — R7303 Prediabetes: Secondary | ICD-10-CM

## 2021-07-30 DIAGNOSIS — E7800 Pure hypercholesterolemia, unspecified: Secondary | ICD-10-CM

## 2021-07-30 DIAGNOSIS — N522 Drug-induced erectile dysfunction: Secondary | ICD-10-CM

## 2021-07-30 DIAGNOSIS — S93402A Sprain of unspecified ligament of left ankle, initial encounter: Secondary | ICD-10-CM

## 2021-07-30 DIAGNOSIS — I10 Essential (primary) hypertension: Secondary | ICD-10-CM | POA: Diagnosis not present

## 2021-07-30 DIAGNOSIS — Z7689 Persons encountering health services in other specified circumstances: Secondary | ICD-10-CM

## 2021-07-30 MED ORDER — LOSARTAN POTASSIUM 100 MG PO TABS: 100.0000 mg | ORAL_TABLET | Freq: Every day | ORAL | 1 refills | Status: DC

## 2021-07-30 MED ORDER — ATORVASTATIN CALCIUM 10 MG PO TABS: 10.0000 mg | ORAL_TABLET | Freq: Every day | ORAL | 1 refills | Status: DC

## 2021-07-30 MED ORDER — AMLODIPINE BESYLATE 10 MG PO TABS: 10.0000 mg | ORAL_TABLET | Freq: Every day | ORAL | 1 refills | Status: DC

## 2021-07-30 NOTE — Assessment & Plan Note (Signed)
Patient was playing basketball and seems to likely have sprained ankle.  Mild edema no ecchymosis.  Did offer to do x-ray in office.  Patient declined continues to conservative treatment ?

## 2021-07-30 NOTE — Assessment & Plan Note (Signed)
Oertli maintained on atorvastatin 10 mg.  Tolerating medication well.  Pending lab results today.  Encouraged healthy lifestyle modifications ?

## 2021-07-30 NOTE — Patient Instructions (Signed)
Nice to see you today ?Check your blood pressure 3-4 times a week at home at varying times of the day. Record them and send them to me via mychart when you have approx 6 readings. ?I will be in touch with the lab results ?I want to see you in 1 month to make sure the blood pressure is doing better ? ?

## 2021-07-30 NOTE — Progress Notes (Signed)
? ?Established Patient Office Visit ? ?Subjective   ?Patient ID: Ruben Welch, male    DOB: July 17, 1975  Age: 46 y.o. MRN: [id number] ? ?Chief Complaint  ?Patient presents with  ? Transfer of Care  ? Medication Refill  ? ? ? ? ?Transfer of care ? ?HTN: losartan and amlopidine. States that he will check it once every 2 weeks. State it has been 160s over. No sympotms ? ?HLD: atorvastatin.  Tolerates the medication well ? ?Diet: one meal a day. Will do a small breakfast intermittently. Mostly water and some juice ? ?Exercise: no current exercise.  ? ?ED: States that his erection was not as strong but doing ok now ? ?Sprain of left ankle: states he was trying basketball and was trying to change directoins and it rolled. Did not hear a snap crack or pop. No brusiing. No numbness or tingling ? ? ? ?Review of Systems  ?Constitutional:  Negative for chills, fever and malaise/fatigue.  ?Respiratory:  Negative for cough and shortness of breath.   ?Cardiovascular:  Negative for chest pain, palpitations and leg swelling.  ?Gastrointestinal:  Negative for abdominal pain, diarrhea, nausea and vomiting.  ?     BM daily  ?Genitourinary:  Negative for dysuria and hematuria.  ?     Nocturia x intermittently  ?Musculoskeletal:  Positive for joint pain.  ?Neurological:  Negative for dizziness, tingling and headaches.  ?Psychiatric/Behavioral:  Negative for hallucinations and suicidal ideas.   ? ?  ?Objective:  ?  ? ?BP (!) 176/108   Pulse 97   Temp (!) 97.2 ?F (36.2 ?C)   Resp 14   Ht 5' 6.75" (1.695 m)   Wt 182 lb 2 oz (82.6 kg)   SpO2 96%   BMI 28.74 kg/m?  ? ? ?Physical Exam ?Vitals and nursing note reviewed.  ?Constitutional:   ?   Appearance: Normal appearance.  ?HENT:  ?   Right Ear: Tympanic membrane, ear canal and external ear normal.  ?   Left Ear: Tympanic membrane, ear canal and external ear normal.  ?   Mouth/Throat:  ?   Mouth: Mucous membranes are moist.  ?   Pharynx: Oropharynx is clear.  ?Eyes:  ?    Extraocular Movements: Extraocular movements intact.  ?   Pupils: Pupils are equal, round, and reactive to light.  ?Cardiovascular:  ?   Rate and Rhythm: Normal rate and regular rhythm.  ?   Pulses: Normal pulses.  ?   Heart sounds: Normal heart sounds.  ?Pulmonary:  ?   Effort: Pulmonary effort is normal.  ?   Breath sounds: Normal breath sounds.  ?Abdominal:  ?   General: Bowel sounds are normal. There is no distension.  ?   Palpations: There is no mass.  ?   Tenderness: There is no abdominal tenderness.  ?   Hernia: No hernia is present.  ?Musculoskeletal:     ?   General: Tenderness and signs of injury present.  ?   Right lower leg: No edema.  ?   Left lower leg: No edema.  ?     Legs: ? ?   Comments: Patient able to do full range of motion with discomfort.  PMS intact  ?Lymphadenopathy:  ?   Cervical: No cervical adenopathy.  ?Skin: ?   General: Skin is warm.  ?Neurological:  ?   General: No focal deficit present.  ?   Mental Status: He is alert.  ?   Deep Tendon Reflexes:  ?  Reflex Scores: ?     Bicep reflexes are 2+ on the right side and 2+ on the left side. ?     Patellar reflexes are 2+ on the right side and 2+ on the left side. ?   Comments: Bilateral upper and lower extremity strength 5/5  ?Psychiatric:     ?   Mood and Affect: Mood normal.     ?   Behavior: Behavior normal.     ?   Thought Content: Thought content normal.     ?   Judgment: Judgment normal.  ? ? ? ?No results found for any visits on 07/30/21. ? ? ? ?The 10-year ASCVD risk score (Arnett DK, et al., 2019) is: 11.7% ? ?  ?Assessment & Plan:  ? ?Problem List Items Addressed This Visit   ? ?  ? Cardiovascular and Mediastinum  ? Essential hypertension  ?  Patient was maintained on losartan 100 mg and amlodipine 10 mg.  Patient has been stretching medication due to to having an appoint with new provider yet.  Continue medication as prescribed encouraged check blood pressure at home ? ?  ?  ? Relevant Medications  ? amLODipine (NORVASC) 10 MG  tablet  ? atorvastatin (LIPITOR) 10 MG tablet  ? losartan (COZAAR) 100 MG tablet  ? Other Relevant Orders  ? CBC  ? Comprehensive metabolic panel  ?  ? Musculoskeletal and Integument  ? Sprain of left ankle  ?  Patient was playing basketball and seems to likely have sprained ankle.  Mild edema no ecchymosis.  Did offer to do x-ray in office.  Patient declined continues to conservative treatment ? ?  ?  ?  ? Other  ? Pure hypercholesterolemia  ?  Oertli maintained on atorvastatin 10 mg.  Tolerating medication well.  Pending lab results today.  Encouraged healthy lifestyle modifications ? ?  ?  ? Relevant Medications  ? amLODipine (NORVASC) 10 MG tablet  ? atorvastatin (LIPITOR) 10 MG tablet  ? losartan (COZAAR) 100 MG tablet  ? Other Relevant Orders  ? Lipid panel  ? Prediabetes  ?  Not currently exercising pending lab results today. ? ?  ?  ? Relevant Orders  ? Hemoglobin A1c  ? Drug-induced erectile dysfunction  ?  Doing well without medication currently.  Continue to monitor ? ?  ?  ? ?Other Visit Diagnoses   ? ? Encounter to establish care    -  Primary  ? ?  ? ? ?Return in about 4 weeks (around 08/27/2021) for Blood pressure recheck.  ? ? ?Romilda Garret, NP ? ?

## 2021-07-30 NOTE — Assessment & Plan Note (Signed)
Patient was maintained on losartan 100 mg and amlodipine 10 mg.  Patient has been stretching medication due to to having an appoint with new provider yet.  Continue medication as prescribed encouraged check blood pressure at home ?

## 2021-07-30 NOTE — Assessment & Plan Note (Signed)
Not currently exercising pending lab results today. ?

## 2021-07-30 NOTE — Assessment & Plan Note (Signed)
Doing well without medication currently.  Continue to monitor ?

## 2021-07-31 LAB — COMPREHENSIVE METABOLIC PANEL
ALT: 65 U/L — ABNORMAL HIGH (ref 0–53)
AST: 36 U/L (ref 0–37)
Albumin: 4.9 g/dL (ref 3.5–5.2)
Alkaline Phosphatase: 55 U/L (ref 39–117)
BUN: 14 mg/dL (ref 6–23)
CO2: 27 mEq/L (ref 19–32)
Calcium: 9.6 mg/dL (ref 8.4–10.5)
Chloride: 102 mEq/L (ref 96–112)
Creatinine, Ser: 1.29 mg/dL (ref 0.40–1.50)
GFR: 67.5 mL/min (ref >60.00)
Glucose, Bld: 92 mg/dL (ref 70–99)
Potassium: 4 mEq/L (ref 3.5–5.1)
Sodium: 139 mEq/L (ref 135–145)
Total Bilirubin: 0.6 mg/dL (ref 0.2–1.2)
Total Protein: 8.7 g/dL — ABNORMAL HIGH (ref 6.0–8.3)

## 2021-07-31 LAB — HEMOGLOBIN A1C: Hgb A1c MFr Bld: 6 % (ref 4.6–6.5)

## 2021-07-31 LAB — CBC
HCT: 45.9 % (ref 39.0–52.0)
Hemoglobin: 15.1 g/dL (ref 13.0–17.0)
MCHC: 32.9 g/dL (ref 30.0–36.0)
MCV: 84.1 fl (ref 78.0–100.0)
Platelets: 276 10*3/uL (ref 150.0–400.0)
RBC: 5.45 Mil/uL (ref 4.22–5.81)
RDW: 14.5 % (ref 11.5–15.5)
WBC: 10.1 10*3/uL (ref 4.0–10.5)

## 2021-07-31 LAB — LDL CHOLESTEROL, DIRECT: Direct LDL: 114 mg/dL

## 2021-07-31 LAB — LIPID PANEL
Cholesterol: 215 mg/dL — ABNORMAL HIGH (ref 0–200)
HDL: 69.1 mg/dL (ref >39.00)
NonHDL: 145.85
Total CHOL/HDL Ratio: 3
Triglycerides: 322 mg/dL — ABNORMAL HIGH (ref 0.0–149.0)
VLDL: 64.4 mg/dL — ABNORMAL HIGH (ref 0.0–40.0)

## 2021-08-29 ENCOUNTER — Telehealth: Payer: Self-pay | Admitting: Nurse Practitioner

## 2021-08-29 ENCOUNTER — Encounter: Payer: Self-pay | Admitting: Nurse Practitioner

## 2021-08-29 ENCOUNTER — Ambulatory Visit (INDEPENDENT_AMBULATORY_CARE_PROVIDER_SITE_OTHER): Payer: BC Managed Care – PPO | Admitting: Nurse Practitioner

## 2021-08-29 VITALS — BP 138/100 | HR 92 | Temp 96.9°F | Resp 14 | Ht 66.75 in | Wt 184.4 lb

## 2021-08-29 DIAGNOSIS — I10 Essential (primary) hypertension: Secondary | ICD-10-CM

## 2021-08-29 MED ORDER — HYDROCHLOROTHIAZIDE 12.5 MG PO TABS
12.5000 mg | ORAL_TABLET | Freq: Every day | ORAL | 1 refills | Status: DC
Start: 2021-08-29 — End: 2021-11-21

## 2021-08-29 NOTE — Patient Instructions (Signed)
Continue checking blood pressure at home and let me know what it is via my chart. I will reach out to your dentist about your procedure Follow up with me in 1 month for recheck

## 2021-08-29 NOTE — Assessment & Plan Note (Signed)
Patient currently maintained on losartan 100 mg IM and amlodipine 10 mg.  Patient blood pressure still above goal.  Patient is to be on hydrochlorothiazide 25 mg daily but was taken off as due to a bump in creatinine.  Did consider switching patient from losartan to valsartan but looking back in the medicine he has tried that before and was pushed off that medication.  We will half the dose of hydrochlorothiazide to 12.5 mg daily see if we can get him within goal blood pressure wise and not affect his kidneys too much.  We will have him follow-up 1 month for a BMP recheck and blood pressure recheck.

## 2021-08-29 NOTE — Telephone Encounter (Signed)
Spoke with Aundra Millet at the dentist office, Dr Cyndee Brightly just needed a note from PCP stating patient is stable/controlled with BP before they proceed with any kind of anesthetic procedures/crown. Advised Megan B/P not controlled today and we will have a follow up on 09/28/21 to re access. When patient is ok to proceed need note faxed to them at [phone] added this note to patient's next appt notes as a reminder to complete this

## 2021-08-29 NOTE — Progress Notes (Signed)
   Established Patient Office Visit  Subjective   Patient ID: Ruben Welch, male    DOB: 1976-01-31  Age: 46 y.o. MRN: [id number]  Chief Complaint  Patient presents with   Hypertension    Has been checking b/p at home-150-120s/95-98      HTN: states that he has been checking his blood pressure at home.  They have been ranging from In[place] to above goal.  Patient did bring his cuff today and did check it against ours he got 156/100 and our original blood pressure was 150/100. States has been taking medication as prescribed and tolerating well.  Blood pressure still slightly above goal.  Patient has been on HCTZ in the past but was taken off due to bump in creatinine.  States he was seen by [facility] off of [address] in [place] thinks the dentist name is Dr. Cyndee Brightly.  States this requires medical clearance due to his blood pressure prior to doing the dental procedure.  Did ask patient if they gave him a form.  States no is a handwritten thing that states he needs a letter clearing patient for procedure.  We will reach out to the office and see     Review of Systems  Constitutional:  Negative for chills and fever.  Respiratory:  Negative for shortness of breath.   Cardiovascular:  Negative for chest pain and leg swelling.     Objective:     BP (!) 138/100   Pulse 92   Temp (!) 96.9 F (36.1 C)   Resp 14   Ht 5' 6.75" (1.695 m)   Wt 184 lb 7 oz (83.7 kg)   SpO2 99%   BMI 29.10 kg/m  BP Readings from Last 3 Encounters:  08/29/21 (!) 138/100  07/30/21 (!) 176/108  06/09/20 138/82      Physical Exam Vitals and nursing note reviewed.  Constitutional:      Appearance: Normal appearance.  Cardiovascular:     Rate and Rhythm: Normal rate and regular rhythm.     Heart sounds: Normal heart sounds.  Pulmonary:     Effort: Pulmonary effort is normal.     Breath sounds: Normal breath sounds.  Neurological:     Mental Status: He is alert.      No results found for any visits on 08/29/21.    The 10-year ASCVD risk score (Arnett DK, et al., 2019) is: 6.9%    Assessment & Plan:   Problem List Items Addressed This Visit       Cardiovascular and Mediastinum   Essential hypertension - Primary    Patient currently maintained on losartan 100 mg IM and amlodipine 10 mg.  Patient blood pressure still above goal.  Patient is to be on hydrochlorothiazide 25 mg daily but was taken off as due to a bump in creatinine.  Did consider switching patient from losartan to valsartan but looking back in the medicine he has tried that before and was pushed off that medication.  We will half the dose of hydrochlorothiazide to 12.5 mg daily see if we can get him within goal blood pressure wise and not affect his kidneys too much.  We will have him follow-up 1 month for a BMP recheck and blood pressure recheck.       Relevant Medications   hydrochlorothiazide (HYDRODIURIL) 12.5 MG tablet    Return in about 4 weeks (around 09/26/2021) for BMP and blood pressure recheck.    Audria Nine, NP

## 2021-08-29 NOTE — Telephone Encounter (Signed)
Can we reach out to Dr. Esmeralda Links at  [facility] and see if there is a form that is required for medical clearance? The phone number is [phone]

## 2021-09-05 ENCOUNTER — Ambulatory Visit: Payer: BC Managed Care – PPO | Admitting: Nurse Practitioner

## 2021-09-12 ENCOUNTER — Other Ambulatory Visit: Payer: Self-pay | Admitting: Nurse Practitioner

## 2021-09-12 DIAGNOSIS — I10 Essential (primary) hypertension: Secondary | ICD-10-CM

## 2021-09-28 ENCOUNTER — Ambulatory Visit: Payer: BC Managed Care – PPO | Admitting: Nurse Practitioner

## 2021-10-13 ENCOUNTER — Other Ambulatory Visit: Payer: Self-pay | Admitting: Nurse Practitioner

## 2021-10-13 DIAGNOSIS — I10 Essential (primary) hypertension: Secondary | ICD-10-CM

## 2021-10-15 NOTE — Telephone Encounter (Signed)
Left message to call back to verify, per last RX date patient should have enough until around 10/29/21

## 2021-10-18 NOTE — Telephone Encounter (Signed)
Patient called back and said he does have enough medication at the moment to last him until appt. His appt is tomorrow

## 2021-10-19 ENCOUNTER — Encounter: Payer: Self-pay | Admitting: Nurse Practitioner

## 2021-10-19 ENCOUNTER — Ambulatory Visit (INDEPENDENT_AMBULATORY_CARE_PROVIDER_SITE_OTHER): Payer: BC Managed Care – PPO | Admitting: Nurse Practitioner

## 2021-10-19 VITALS — BP 124/80 | HR 81 | Temp 97.3°F | Resp 8 | Ht 66.75 in | Wt 180.0 lb

## 2021-10-19 DIAGNOSIS — M6208 Separation of muscle (nontraumatic), other site: Secondary | ICD-10-CM | POA: Diagnosis not present

## 2021-10-19 DIAGNOSIS — I10 Essential (primary) hypertension: Secondary | ICD-10-CM | POA: Diagnosis not present

## 2021-10-19 LAB — BASIC METABOLIC PANEL
BUN: 20 mg/dL (ref 6–23)
CO2: 29 mEq/L (ref 19–32)
Calcium: 9.7 mg/dL (ref 8.4–10.5)
Chloride: 99 mEq/L (ref 96–112)
Creatinine, Ser: 1.36 mg/dL (ref 0.40–1.50)
GFR: 63.25 mL/min (ref >60.00)
Glucose, Bld: 88 mg/dL (ref 70–99)
Potassium: 3.8 mEq/L (ref 3.5–5.1)
Sodium: 137 mEq/L (ref 135–145)

## 2021-10-19 NOTE — Patient Instructions (Signed)
Nice to see you today Follow up with me in 6 months for your physical, sooner if you need me I will be in touch with the labs once I have them

## 2021-10-19 NOTE — Assessment & Plan Note (Signed)
Patient currently maintained on losartan, HCTZ, and amlodipine.  Blood pressure was slightly elevated as upon getting into clinic.  Towards end of visit rechecked and was normotensive.  Patient continue medications.  We will check renal function on patient today.  Also gave patient a letter of clearance that we can proceed with his crown procedure since his blood pressure is better controlled now

## 2021-10-19 NOTE — Assessment & Plan Note (Signed)
Incidental finding on exam.  This is a concern patient informed patient is to wear a support brace when lifting heavy objects at all for counterpressure.  Signs and symptoms reviewed if he develops a hernia when to be seen emergently

## 2021-10-19 NOTE — Progress Notes (Signed)
   Established Patient Office Visit  Subjective   Patient ID: Ruben Welch, male    DOB: [birth year]  Age: 46 y.o. MRN: [id number]  Chief Complaint  Patient presents with   Hypertension    Follow up- 140-125/89 at home.      HTN: states that he can check his blood pressure at home, does not approx every 2 days. States top is 125-140 and the bottom 89. States that he is not having any symptoms of hypertension.  Tolerating medications well per his report.  States that he is having crown procedure [employer].  This was discussed at his last office visit and patient needs a letter of clearance prior to them doing the dental procedure.    Review of Systems  Constitutional:  Negative for chills and fever.  Eyes:  Negative for blurred vision and double vision.  Respiratory:  Negative for shortness of breath.   Cardiovascular:  Negative for chest pain and leg swelling.  Genitourinary:  Negative for dysuria and hematuria.      Objective:     BP 124/80   Pulse 81   Temp (!) 97.3 F (36.3 C)   Resp (!) 8   Ht 5' 6.75" (1.695 m)   Wt 180 lb (81.6 kg)   SpO2 98%   BMI 28.40 kg/m  BP Readings from Last 3 Encounters:  10/19/21 124/80  08/29/21 (!) 138/100  07/30/21 (!) 176/108   Wt Readings from Last 3 Encounters:  10/19/21 180 lb (81.6 kg)  08/29/21 184 lb 7 oz (83.7 kg)  07/30/21 182 lb 2 oz (82.6 kg)      Physical Exam Vitals and nursing note reviewed.  Constitutional:      Appearance: Normal appearance.  Cardiovascular:     Rate and Rhythm: Normal rate and regular rhythm.     Heart sounds: Normal heart sounds.  Pulmonary:     Effort: Pulmonary effort is normal.     Breath sounds: Normal breath sounds.  Abdominal:     Comments: Rectus diastasis  Musculoskeletal:     Right lower leg: No edema.     Left lower leg: No edema.  Neurological:     Mental Status: He is alert.      No results found for any visits on 10/19/21.    The 10-year ASCVD  risk score (Arnett DK, et al., 2019) is: 5.7%    Assessment & Plan:   Problem List Items Addressed This Visit       Cardiovascular and Mediastinum   Essential hypertension - Primary    Patient currently maintained on losartan, HCTZ, and amlodipine.  Blood pressure was slightly elevated as upon getting into clinic.  Towards end of visit rechecked and was normotensive.  Patient continue medications.  We will check renal function on patient today.  Also gave patient a letter of clearance that we can proceed with his crown procedure since his blood pressure is better controlled now      Relevant Orders   Basic metabolic panel     Musculoskeletal and Integument   Rectus diastasis    Incidental finding on exam.  This is a concern patient informed patient is to wear a support brace when lifting heavy objects at all for counterpressure.  Signs and symptoms reviewed if he develops a hernia when to be seen emergently       Return in about 6 months (around 04/21/2022) for recheck on chronic conditions.    Audria Nine, NP

## 2021-11-21 ENCOUNTER — Other Ambulatory Visit: Payer: Self-pay | Admitting: Nurse Practitioner

## 2021-11-21 DIAGNOSIS — I10 Essential (primary) hypertension: Secondary | ICD-10-CM

## 2022-03-31 ENCOUNTER — Other Ambulatory Visit: Payer: Self-pay | Admitting: Nurse Practitioner

## 2022-03-31 DIAGNOSIS — I10 Essential (primary) hypertension: Secondary | ICD-10-CM

## 2022-04-01 ENCOUNTER — Other Ambulatory Visit: Payer: Self-pay | Admitting: Nurse Practitioner

## 2022-04-01 DIAGNOSIS — E7800 Pure hypercholesterolemia, unspecified: Secondary | ICD-10-CM

## 2022-04-01 NOTE — Telephone Encounter (Signed)
Patient has been scheduled

## 2022-04-02 NOTE — Telephone Encounter (Signed)
Rx sent to pharmacy   

## 2022-04-07 ENCOUNTER — Other Ambulatory Visit: Payer: Self-pay | Admitting: Internal Medicine

## 2022-04-21 ENCOUNTER — Other Ambulatory Visit: Payer: Self-pay | Admitting: Nurse Practitioner

## 2022-04-21 DIAGNOSIS — I10 Essential (primary) hypertension: Secondary | ICD-10-CM

## 2022-04-30 ENCOUNTER — Encounter: Payer: BC Managed Care – PPO | Admitting: Nurse Practitioner

## 2022-05-02 ENCOUNTER — Other Ambulatory Visit: Payer: Self-pay | Admitting: Nurse Practitioner

## 2022-05-02 DIAGNOSIS — I10 Essential (primary) hypertension: Secondary | ICD-10-CM

## 2022-05-03 ENCOUNTER — Encounter: Payer: BC Managed Care – PPO | Admitting: Nurse Practitioner

## 2022-05-03 ENCOUNTER — Other Ambulatory Visit: Payer: Self-pay

## 2022-05-03 DIAGNOSIS — I10 Essential (primary) hypertension: Secondary | ICD-10-CM

## 2022-05-03 MED ORDER — LOSARTAN POTASSIUM 100 MG PO TABS: 100.0000 mg | ORAL_TABLET | Freq: Every day | ORAL | 0 refills | Status: DC

## 2022-05-03 NOTE — Telephone Encounter (Signed)
Spoke to pt, scheduled f/u for 05/09/22

## 2022-05-03 NOTE — Telephone Encounter (Signed)
Last visit 10/20/2022 Next 05/09/2022

## 2022-05-09 ENCOUNTER — Ambulatory Visit (INDEPENDENT_AMBULATORY_CARE_PROVIDER_SITE_OTHER): Payer: BC Managed Care – PPO | Admitting: Nurse Practitioner

## 2022-05-09 ENCOUNTER — Encounter: Payer: Self-pay | Admitting: Nurse Practitioner

## 2022-05-09 VITALS — BP 132/82 | HR 90 | Temp 97.9°F | Ht 66.75 in | Wt 182.2 lb

## 2022-05-09 DIAGNOSIS — I10 Essential (primary) hypertension: Secondary | ICD-10-CM

## 2022-05-09 LAB — BASIC METABOLIC PANEL
BUN: 13 mg/dL (ref 6–23)
CO2: 28 mEq/L (ref 19–32)
Calcium: 9.8 mg/dL (ref 8.4–10.5)
Chloride: 102 mEq/L (ref 96–112)
Creatinine, Ser: 1.27 mg/dL (ref 0.40–1.50)
GFR: 68.41 mL/min (ref >60.00)
Glucose, Bld: 112 mg/dL — ABNORMAL HIGH (ref 70–99)
Potassium: 3.6 mEq/L (ref 3.5–5.1)
Sodium: 139 mEq/L (ref 135–145)

## 2022-05-09 NOTE — Progress Notes (Signed)
   Established Patient Office Visit  Subjective   Patient ID: Ruben Welch, male    DOB: Apr 19, 1975  Age: 47 y.o. MRN: [id number]  Chief Complaint  Patient presents with   Hypertension      HTN: patient is currently maintained on amlodipine, losartan, and HCTZ. States that he is taking  States that he is taking it daily. States that it has been pretty good. States that they ar ranging 130-140s at home and 80-86 on the bottom States that he has not started exercising yet. Does plan to start States that he is doing well with the diet States that he does drink some coffee (3 times a week) and water is the main fluid intake     Review of Systems  Constitutional:  Negative for chills and fever.  Eyes:  Negative for blurred vision and double vision.  Respiratory:  Negative for shortness of breath.   Cardiovascular:  Negative for chest pain and leg swelling.  Neurological:  Negative for tingling and headaches.      Objective:     BP 132/82   Pulse 90   Temp 97.9 F (36.6 C) (Oral)   Ht 5' 6.75" (1.695 m)   Wt 182 lb 3.2 oz (82.6 kg)   SpO2 99%   BMI 28.75 kg/m  BP Readings from Last 3 Encounters:  05/09/22 132/82  10/19/21 124/80  08/29/21 (!) 138/100   Wt Readings from Last 3 Encounters:  05/09/22 182 lb 3.2 oz (82.6 kg)  10/19/21 180 lb (81.6 kg)  08/29/21 184 lb 7 oz (83.7 kg)      Physical Exam Vitals and nursing note reviewed.  Constitutional:      Appearance: Normal appearance.  Cardiovascular:     Rate and Rhythm: Normal rate and regular rhythm.     Heart sounds: Normal heart sounds.  Pulmonary:     Effort: Pulmonary effort is normal.     Breath sounds: Normal breath sounds.  Musculoskeletal:     Right lower leg: No edema.     Left lower leg: No edema.  Neurological:     Mental Status: He is alert.      No results found for any visits on 05/09/22.    The 10-year ASCVD risk score (Arnett DK, et al., 2019) is: 6.7%    Assessment &  Plan:   Problem List Items Addressed This Visit       Cardiovascular and Mediastinum   Essential hypertension - Primary    Patient is taking amlodipine, losartan, hydrochlorothiazide.  Patient is tolerating medications well per his report.  Blood pressure on recheck within normal limits.  Continue medications continue checking blood pressure at home pending renal functions today      Relevant Orders   Basic metabolic panel    Return in about 6 months (around 11/07/2022) for CPE and Labs.    Romilda Garret, NP

## 2022-05-09 NOTE — Assessment & Plan Note (Signed)
Patient is taking amlodipine, losartan, hydrochlorothiazide.  Patient is tolerating medications well per his report.  Blood pressure on recheck within normal limits.  Continue medications continue checking blood pressure at home pending renal functions today

## 2022-05-09 NOTE — Patient Instructions (Signed)
Nice to see you today I want to see you in 6 months for you physical and labs, sooner if you need me I will be in touch with the lab I get today

## 2022-06-10 ENCOUNTER — Other Ambulatory Visit: Payer: Self-pay | Admitting: Nurse Practitioner

## 2022-06-10 DIAGNOSIS — I10 Essential (primary) hypertension: Secondary | ICD-10-CM

## 2022-07-24 ENCOUNTER — Encounter: Payer: Self-pay | Admitting: Nurse Practitioner

## 2022-07-24 MED ORDER — SILDENAFIL CITRATE 100 MG PO TABS: 100.0000 mg | ORAL_TABLET | Freq: Every day | ORAL | 0 refills | Status: DC | PRN

## 2022-09-30 ENCOUNTER — Other Ambulatory Visit: Payer: Self-pay | Admitting: Nurse Practitioner

## 2022-11-20 ENCOUNTER — Other Ambulatory Visit: Payer: Self-pay | Admitting: Nurse Practitioner

## 2022-11-30 ENCOUNTER — Other Ambulatory Visit: Payer: Self-pay | Admitting: Nurse Practitioner

## 2022-11-30 DIAGNOSIS — I10 Essential (primary) hypertension: Secondary | ICD-10-CM

## 2022-12-03 NOTE — Telephone Encounter (Signed)
Can we schedule the patient for a cope within the next 90 days please

## 2022-12-04 NOTE — Telephone Encounter (Signed)
Scheduled pt'e cpe for 02/28/23

## 2022-12-07 ENCOUNTER — Other Ambulatory Visit: Payer: Self-pay | Admitting: Nurse Practitioner

## 2022-12-07 DIAGNOSIS — E7800 Pure hypercholesterolemia, unspecified: Secondary | ICD-10-CM

## 2022-12-09 NOTE — Telephone Encounter (Signed)
Last note you wanted patent to follow up in aug do not see where patient was seen.

## 2023-01-30 ENCOUNTER — Other Ambulatory Visit: Payer: Self-pay | Admitting: Nurse Practitioner

## 2023-01-30 DIAGNOSIS — I10 Essential (primary) hypertension: Secondary | ICD-10-CM

## 2023-02-01 ENCOUNTER — Other Ambulatory Visit: Payer: Self-pay | Admitting: Nurse Practitioner

## 2023-02-01 DIAGNOSIS — I10 Essential (primary) hypertension: Secondary | ICD-10-CM

## 2023-02-28 ENCOUNTER — Ambulatory Visit (INDEPENDENT_AMBULATORY_CARE_PROVIDER_SITE_OTHER): Payer: BC Managed Care – PPO | Admitting: Nurse Practitioner

## 2023-02-28 ENCOUNTER — Encounter: Payer: Self-pay | Admitting: Nurse Practitioner

## 2023-02-28 VITALS — BP 128/96 | HR 94 | Temp 98.3°F | Ht 67.0 in | Wt 179.0 lb

## 2023-02-28 DIAGNOSIS — I10 Essential (primary) hypertension: Secondary | ICD-10-CM | POA: Diagnosis not present

## 2023-02-28 DIAGNOSIS — Z125 Encounter for screening for malignant neoplasm of prostate: Secondary | ICD-10-CM | POA: Diagnosis not present

## 2023-02-28 DIAGNOSIS — E7800 Pure hypercholesterolemia, unspecified: Secondary | ICD-10-CM

## 2023-02-28 DIAGNOSIS — N522 Drug-induced erectile dysfunction: Secondary | ICD-10-CM

## 2023-02-28 DIAGNOSIS — R7303 Prediabetes: Secondary | ICD-10-CM | POA: Diagnosis not present

## 2023-02-28 DIAGNOSIS — Z0000 Encounter for general adult medical examination without abnormal findings: Secondary | ICD-10-CM | POA: Diagnosis not present

## 2023-02-28 DIAGNOSIS — Z1211 Encounter for screening for malignant neoplasm of colon: Secondary | ICD-10-CM

## 2023-02-28 LAB — CBC
HCT: 45.8 % (ref 39.0–52.0)
Hemoglobin: 14.9 g/dL (ref 13.0–17.0)
MCHC: 32.5 g/dL (ref 30.0–36.0)
MCV: 84.8 fL (ref 78.0–100.0)
Platelets: 282 10*3/uL (ref 150.0–400.0)
RBC: 5.41 Mil/uL (ref 4.22–5.81)
RDW: 14.1 % (ref 11.5–15.5)
WBC: 6.6 10*3/uL (ref 4.0–10.5)

## 2023-02-28 LAB — COMPREHENSIVE METABOLIC PANEL
ALT: 42 U/L (ref 0–53)
AST: 26 U/L (ref 0–37)
Albumin: 4.8 g/dL (ref 3.5–5.2)
Alkaline Phosphatase: 45 U/L (ref 39–117)
BUN: 11 mg/dL (ref 6–23)
CO2: 30 meq/L (ref 19–32)
Calcium: 9.5 mg/dL (ref 8.4–10.5)
Chloride: 100 meq/L (ref 96–112)
Creatinine, Ser: 1.23 mg/dL (ref 0.40–1.50)
GFR: 70.68 mL/min (ref >60.00)
Glucose, Bld: 91 mg/dL (ref 70–99)
Potassium: 4.2 meq/L (ref 3.5–5.1)
Sodium: 137 meq/L (ref 135–145)
Total Bilirubin: 0.8 mg/dL (ref 0.2–1.2)
Total Protein: 7.7 g/dL (ref 6.0–8.3)

## 2023-02-28 LAB — LIPID PANEL
Cholesterol: 165 mg/dL (ref 0–200)
HDL: 47.7 mg/dL (ref >39.00)
LDL Cholesterol: 98 mg/dL (ref 0–99)
NonHDL: 116.92
Total CHOL/HDL Ratio: 3
Triglycerides: 96 mg/dL (ref 0.0–149.0)
VLDL: 19.2 mg/dL (ref 0.0–40.0)

## 2023-02-28 LAB — TSH: TSH: 1.95 u[IU]/mL (ref 0.35–5.50)

## 2023-02-28 LAB — HEMOGLOBIN A1C: Hgb A1c MFr Bld: 6.1 % (ref 4.6–6.5)

## 2023-02-28 LAB — PSA: PSA: 0.62 ng/mL (ref 0.10–4.00)

## 2023-02-28 MED ORDER — AMLODIPINE BESYLATE 10 MG PO TABS: 10.0000 mg | ORAL_TABLET | Freq: Every day | ORAL | 3 refills | Status: DC

## 2023-02-28 MED ORDER — LOSARTAN POTASSIUM 100 MG PO TABS: 100.0000 mg | ORAL_TABLET | Freq: Every day | ORAL | 3 refills | Status: DC

## 2023-02-28 NOTE — Patient Instructions (Signed)
Nice to see you today I want your blood pressure under 140 on the top and under 90 on the bottom.  Follow up with me in 1 year, sooner if you need me Send me your at home blood pressure readings via mychart

## 2023-02-28 NOTE — Assessment & Plan Note (Signed)
Patient currently maintained on sildenafil 100 mg as needed.  Continue

## 2023-02-28 NOTE — Assessment & Plan Note (Signed)
Patient currently maintained on atorvastatin 10 mg daily pending lipid panel

## 2023-02-28 NOTE — Assessment & Plan Note (Signed)
Pending A1c today 

## 2023-02-28 NOTE — Assessment & Plan Note (Signed)
Discussed age-appropriate immunizations and screening exams.  Did review patient's personal, surgical, social, family histories.  Patient is up-to-date on all age-appropriate vaccinations he would like.  Patient deferred Tdap today and refused flu vaccine today.  Ambulatory order placed for Cologuard for CRC screening.  PSA ordered for prostate cancer screening today.  Patient was given information at discharge about preventative healthcare maintenance with anticipatory guidance.

## 2023-02-28 NOTE — Assessment & Plan Note (Signed)
Patient currently maintained on amlodipine 10 mg, losartan 100 mg.  Patient had been maintained on HCTZ in the past but has been off it for over a year and blood pressures have been within goal per his report.  Will continue staying off of HCTZ and patient to check blood pressure over the next couple weeks if above goal start HCTZ back

## 2023-02-28 NOTE — Progress Notes (Signed)
Established Patient Office Visit  Subjective   Patient ID: Ruben Welch, male    DOB: 09-11-1975  Age: 47 y.o. MRN: [id number]  Chief Complaint  Patient presents with   Annual Exam    HPI  Htn: state that he will cheick it 3-4 times a day. States that he has been off the hydrochlorothiazide for over a year. State that he was doing ok  HLD: Patient currently maintained on atorvastatin 10 mg  ED: states that the higher dose of sildenafil seems to be helpful. States that maintaining erection is what the patient has difficulty with  for complete physical and follow up of chronic conditions.  Immunizations: -Tetanus: Completed in 2014, patient deferred  -Influenza: refused  -Shingles: Too young -Pneumonia: Too young  Diet: Fair diet. He is eating 1 larger meal and a smaller snack. He will drink water and sometimes he will drink cup of beet Exercise:  States that he is walking 10-15 mins a day   Eye exam: Completes annually. Glasses and contacts  Dental exam: Completes semi-annually    Colonoscopy: Ordered for cologuard  Lung Cancer Screening: N/A  PSA: Due  Sleep: he will go to bed around 9-10 and get up around 5-6. Feels rested. Does snore        Review of Systems  Constitutional:  Negative for chills and fever.  Respiratory:  Negative for shortness of breath.   Cardiovascular:  Negative for chest pain and leg swelling.  Gastrointestinal:  Negative for abdominal pain, blood in stool, constipation, diarrhea, nausea and vomiting.       BM daily   Genitourinary:  Negative for dysuria and hematuria.  Neurological:  Negative for tingling and headaches.  Psychiatric/Behavioral:  Negative for hallucinations and suicidal ideas.       Objective:     BP (!) 128/96   Pulse 94   Temp 98.3 F (36.8 C) (Oral)   Ht 5\' 7"  (1.702 m)   Wt 179 lb (81.2 kg)   SpO2 98%   BMI 28.04 kg/m  BP Readings from Last 3 Encounters:  02/28/23 (!) 128/96  05/09/22 132/82   10/19/21 124/80   Wt Readings from Last 3 Encounters:  02/28/23 179 lb (81.2 kg)  05/09/22 182 lb 3.2 oz (82.6 kg)  10/19/21 180 lb (81.6 kg)   SpO2 Readings from Last 3 Encounters:  02/28/23 98%  05/09/22 99%  10/19/21 98%      Physical Exam Vitals and nursing note reviewed.  Constitutional:      Appearance: Normal appearance.  HENT:     Right Ear: Tympanic membrane, ear canal and external ear normal.     Left Ear: Tympanic membrane, ear canal and external ear normal.     Mouth/Throat:     Mouth: Mucous membranes are moist.     Pharynx: Oropharynx is clear.  Eyes:     Extraocular Movements: Extraocular movements intact.     Pupils: Pupils are equal, round, and reactive to light.  Cardiovascular:     Rate and Rhythm: Normal rate and regular rhythm.     Pulses: Normal pulses.     Heart sounds: Normal heart sounds.  Pulmonary:     Effort: Pulmonary effort is normal.     Breath sounds: Normal breath sounds.  Abdominal:     General: Bowel sounds are normal. There is no distension.     Palpations: There is no mass.     Tenderness: There is no abdominal tenderness.     Hernia:  No hernia is present.  Musculoskeletal:     Right lower leg: No edema.     Left lower leg: No edema.  Lymphadenopathy:     Cervical: No cervical adenopathy.  Skin:    General: Skin is warm.  Neurological:     General: No focal deficit present.     Mental Status: He is alert.     Deep Tendon Reflexes:     Reflex Scores:      Bicep reflexes are 2+ on the right side and 2+ on the left side.      Patellar reflexes are 2+ on the right side and 2+ on the left side.    Comments: Bilateral upper and lower extremity strength 5/5  Psychiatric:        Mood and Affect: Mood normal.        Behavior: Behavior normal.        Thought Content: Thought content normal.        Judgment: Judgment normal.      No results found for any visits on 02/28/23.    The 10-year ASCVD risk score (Arnett DK, et  al., 2019) is: 6.3%    Assessment & Plan:   Problem List Items Addressed This Visit       Cardiovascular and Mediastinum   Essential hypertension    Patient currently maintained on amlodipine 10 mg, losartan 100 mg.  Patient had been maintained on HCTZ in the past but has been off it for over a year and blood pressures have been within goal per his report.  Will continue staying off of HCTZ and patient to check blood pressure over the next couple weeks if above goal start HCTZ back      Relevant Medications   losartan (COZAAR) 100 MG tablet   amLODipine (NORVASC) 10 MG tablet   Other Relevant Orders   CBC   Comprehensive metabolic panel     Other   Preventative health care - Primary    Discussed age-appropriate immunizations and screening exams.  Did review patient's personal, surgical, social, family histories.  Patient is up-to-date on all age-appropriate vaccinations he would like.  Patient deferred Tdap today and refused flu vaccine today.  Ambulatory order placed for Cologuard for CRC screening.  PSA ordered for prostate cancer screening today.  Patient was given information at discharge about preventative healthcare maintenance with anticipatory guidance.      Relevant Orders   CBC   Comprehensive metabolic panel   TSH   Pure hypercholesterolemia    Patient currently maintained on atorvastatin 10 mg daily pending lipid panel      Relevant Medications   losartan (COZAAR) 100 MG tablet   amLODipine (NORVASC) 10 MG tablet   Other Relevant Orders   Lipid panel   Prediabetes    Pending A1c today.      Relevant Orders   Hemoglobin A1c   Drug-induced erectile dysfunction    Patient currently maintained on sildenafil 100 mg as needed.  Continue      Other Visit Diagnoses     Screening for colon cancer       Relevant Orders   Cologuard   Screening for prostate cancer       Relevant Orders   PSA       Return in about 1 year (around 02/28/2024) for CPE and Labs.     Audria Nine, NP

## 2023-03-14 ENCOUNTER — Telehealth: Payer: Self-pay | Admitting: Nurse Practitioner

## 2023-03-14 NOTE — Telephone Encounter (Signed)
Contacted pt and relayed information . Pt verbalized understanding and has no question or concerns.

## 2023-03-14 NOTE — Telephone Encounter (Addendum)
Contacted pt about BP readings.  Pt stated that his BP readings have been below 140/90. Pt does not remember all readings that were done.  Has taken BP reading today @ 1pm: 136/84.

## 2023-03-14 NOTE — Telephone Encounter (Signed)
Can we call the patient and see what his home blood pressure readings have been.

## 2023-03-14 NOTE — Telephone Encounter (Signed)
Noted.  Sounds good no change in medication at this time continue checking blood pressures at home.  Please inform patient to let me know if top number is above 140 consistently or bottom number is above 90 consistently.

## 2023-03-14 NOTE — Telephone Encounter (Signed)
-----   Message from [facility] sent at 02/28/2023  8:52 AM EST ----- Regarding: BP What has home bp readings been

## 2023-04-24 ENCOUNTER — Encounter: Payer: Self-pay | Admitting: Nurse Practitioner

## 2023-04-24 MED ORDER — VIAGRA 100 MG PO TABS: 50.0000 mg | ORAL_TABLET | Freq: Every day | ORAL | 1 refills | Status: DC | PRN

## 2023-04-25 ENCOUNTER — Other Ambulatory Visit (HOSPITAL_COMMUNITY): Payer: Self-pay

## 2023-06-19 ENCOUNTER — Other Ambulatory Visit: Payer: Self-pay | Admitting: Nurse Practitioner

## 2023-07-16 ENCOUNTER — Other Ambulatory Visit: Payer: Self-pay | Admitting: Nurse Practitioner

## 2023-07-16 DIAGNOSIS — E7800 Pure hypercholesterolemia, unspecified: Secondary | ICD-10-CM

## 2024-01-07 ENCOUNTER — Other Ambulatory Visit: Payer: Self-pay | Admitting: Nurse Practitioner

## 2024-02-12 ENCOUNTER — Other Ambulatory Visit: Payer: Self-pay | Admitting: Nurse Practitioner

## 2024-02-12 DIAGNOSIS — E7800 Pure hypercholesterolemia, unspecified: Secondary | ICD-10-CM

## 2024-02-13 ENCOUNTER — Other Ambulatory Visit: Payer: Self-pay | Admitting: Nurse Practitioner

## 2024-03-05 ENCOUNTER — Encounter: Payer: Self-pay | Admitting: Nurse Practitioner

## 2024-03-05 ENCOUNTER — Ambulatory Visit: Payer: BC Managed Care – PPO | Admitting: Nurse Practitioner

## 2024-03-05 VITALS — BP 146/92 | HR 97 | Temp 98.2°F | Ht 67.0 in | Wt 184.0 lb

## 2024-03-05 DIAGNOSIS — Z125 Encounter for screening for malignant neoplasm of prostate: Secondary | ICD-10-CM

## 2024-03-05 DIAGNOSIS — Z0000 Encounter for general adult medical examination without abnormal findings: Secondary | ICD-10-CM

## 2024-03-05 DIAGNOSIS — I10 Essential (primary) hypertension: Secondary | ICD-10-CM

## 2024-03-05 DIAGNOSIS — R7303 Prediabetes: Secondary | ICD-10-CM | POA: Diagnosis not present

## 2024-03-05 DIAGNOSIS — Z1211 Encounter for screening for malignant neoplasm of colon: Secondary | ICD-10-CM

## 2024-03-05 DIAGNOSIS — N522 Drug-induced erectile dysfunction: Secondary | ICD-10-CM

## 2024-03-05 DIAGNOSIS — E7800 Pure hypercholesterolemia, unspecified: Secondary | ICD-10-CM | POA: Diagnosis not present

## 2024-03-05 LAB — COMPREHENSIVE METABOLIC PANEL WITH GFR
ALT: 52 U/L (ref 0–53)
AST: 30 U/L (ref 0–37)
Albumin: 5.2 g/dL (ref 3.5–5.2)
Alkaline Phosphatase: 45 U/L (ref 39–117)
BUN: 17 mg/dL (ref 6–23)
CO2: 26 meq/L (ref 19–32)
Calcium: 10.1 mg/dL (ref 8.4–10.5)
Chloride: 98 meq/L (ref 96–112)
Creatinine, Ser: 1.34 mg/dL (ref 0.40–1.50)
GFR: 63.32 mL/min (ref >60.00)
Glucose, Bld: 80 mg/dL (ref 70–99)
Potassium: 4.1 meq/L (ref 3.5–5.1)
Sodium: 138 meq/L (ref 135–145)
Total Bilirubin: 0.9 mg/dL (ref 0.2–1.2)
Total Protein: 8.1 g/dL (ref 6.0–8.3)

## 2024-03-05 LAB — CBC WITH DIFFERENTIAL/PLATELET
Basophils Absolute: 0.1 K/uL (ref 0.0–0.1)
Basophils Relative: 1 % (ref 0.0–3.0)
Eosinophils Absolute: 0.1 K/uL (ref 0.0–0.7)
Eosinophils Relative: 1.8 % (ref 0.0–5.0)
HCT: 45.5 % (ref 39.0–52.0)
Hemoglobin: 15.1 g/dL (ref 13.0–17.0)
Lymphocytes Relative: 31.2 % (ref 12.0–46.0)
Lymphs Abs: 2.2 K/uL (ref 0.7–4.0)
MCHC: 33.2 g/dL (ref 30.0–36.0)
MCV: 81.6 fl (ref 78.0–100.0)
Monocytes Absolute: 0.5 K/uL (ref 0.1–1.0)
Monocytes Relative: 7.6 % (ref 3.0–12.0)
Neutro Abs: 4.1 K/uL (ref 1.4–7.7)
Neutrophils Relative %: 58.4 % (ref 43.0–77.0)
Platelets: 278 K/uL (ref 150.0–400.0)
RBC: 5.58 Mil/uL (ref 4.22–5.81)
RDW: 14.3 % (ref 11.5–15.5)
WBC: 7.1 K/uL (ref 4.0–10.5)

## 2024-03-05 LAB — LIPID PANEL
Cholesterol: 167 mg/dL (ref 0–200)
HDL: 47.3 mg/dL (ref >39.00)
LDL Cholesterol: 88 mg/dL (ref 0–99)
NonHDL: 119.24
Total CHOL/HDL Ratio: 4
Triglycerides: 157 mg/dL — ABNORMAL HIGH (ref 0.0–149.0)
VLDL: 31.4 mg/dL (ref 0.0–40.0)

## 2024-03-05 LAB — PSA: PSA: 0.65 ng/mL (ref 0.10–4.00)

## 2024-03-05 LAB — HEMOGLOBIN A1C: Hgb A1c MFr Bld: 6.1 % (ref 4.6–6.5)

## 2024-03-05 LAB — TSH: TSH: 1.81 u[IU]/mL (ref 0.35–5.50)

## 2024-03-05 MED ORDER — VALSARTAN 160 MG PO TABS: 160.0000 mg | ORAL_TABLET | Freq: Every day | ORAL | 0 refills | Status: DC

## 2024-03-05 NOTE — Patient Instructions (Signed)
 Nice to see you today  I will be I touch with the labs once I have them  Follow up with me in 3 months, sooner if you need me  Check your blood pressure at home. I want the top number under 140 and the bottom number under 90 consistently  STOP taking the losartan  and start taking the valsartan 

## 2024-03-05 NOTE — Assessment & Plan Note (Signed)
History of the same pending A1c today. ?

## 2024-03-05 NOTE — Assessment & Plan Note (Signed)
 Patient currently on amlodipine  10 mg daily and losartan  100 mg daily.  Blood pressure not controlled in office or at home.  Has tried HCTZ with adverse drug event.  We will switch losartan  to valsartan  160 mg daily.  Continue checking blood pressure at home

## 2024-03-05 NOTE — Assessment & Plan Note (Signed)
 Currently maintained on sildenafil  100 mg daily as needed sexual intercourse.  Continue

## 2024-03-05 NOTE — Assessment & Plan Note (Signed)
 Discussed age-appropriate immunizations and screening exams.  Did review patient's personal, surgical, social, family histories.  Patient is up-to-date with all age-appropriate vaccinations he would like.  Patient declined tetanus and flu vaccine today.  Cologuard ordered for CRC screening.  PSA for prostate cancer screening.  Patient was given information at discharge about preventative healthcare maintenance with anticipatory guidance

## 2024-03-05 NOTE — Progress Notes (Signed)
 Established Patient Office Visit  Subjective   Patient ID: Ruben Welch, male    DOB: 26-Oct-1975  Age: 48 y.o. MRN: [id number]  Chief Complaint  Patient presents with   Annual Exam    HPI  HTN: Patient currently maintained on amlodipine  10 mg daily and losartan  100 mg daily. Has taken his medications this morning. States that he did check it at home 140/90 at home this morning. He is checking it twice a day. He will get readings 132-140 and the bottom number will be 87-90  HLD: Currently maintained on atorvastatin  10 mg daily  ED: Maintained on sildenafil  100 mg daily as needed  for complete physical and follow up of chronic conditions.  Immunizations: -Tetanus: Completed in 2014, declined  -Influenza: declined  -Shingles: Too young -Pneumonia: Too young  Diet: Fair diet. He is eating 1 meal a day and a small snack in between. Drinking mainly water and sometimes juice  Exercise: . States that he is walking/jogging. He is doing it 3 times a week for approx 30  Eye exam: Completes annually. glasses  Dental exam: Completes semi-annually    Colonoscopy: cologuard ordered today Lung Cancer Screening: NA  PSA: Due  Sleep: going to bed aroudn 10 and getting up around 530-6. He feels rested. Does snore        Review of Systems  Constitutional:  Negative for chills and fever.  Respiratory:  Negative for shortness of breath.   Cardiovascular:  Negative for chest pain and leg swelling.  Gastrointestinal:  Negative for abdominal pain, blood in stool, constipation, diarrhea, nausea and vomiting.       Bm daily   Genitourinary:  Negative for dysuria and hematuria.  Neurological:  Negative for dizziness, tingling and headaches.  Psychiatric/Behavioral:  Negative for hallucinations and suicidal ideas.       Objective:     BP (!) 146/92   Pulse 97   Temp 98.2 F (36.8 C) (Oral)   Ht 5' 7 (1.702 m)   Wt 184 lb (83.5 kg)   SpO2 99%   BMI 28.82 kg/m  BP  Readings from Last 3 Encounters:  03/05/24 (!) 146/92  02/28/23 (!) 128/96  05/09/22 132/82   Wt Readings from Last 3 Encounters:  03/05/24 184 lb (83.5 kg)  02/28/23 179 lb (81.2 kg)  05/09/22 182 lb 3.2 oz (82.6 kg)   SpO2 Readings from Last 3 Encounters:  03/05/24 99%  02/28/23 98%  05/09/22 99%      Physical Exam Vitals and nursing note reviewed.  Constitutional:      Appearance: Normal appearance.  HENT:     Right Ear: Tympanic membrane, ear canal and external ear normal.     Left Ear: Tympanic membrane, ear canal and external ear normal.     Mouth/Throat:     Mouth: Mucous membranes are moist.     Pharynx: Oropharynx is clear.  Eyes:     Extraocular Movements: Extraocular movements intact.     Pupils: Pupils are equal, round, and reactive to light.  Cardiovascular:     Rate and Rhythm: Normal rate and regular rhythm.     Pulses: Normal pulses.     Heart sounds: Normal heart sounds.  Pulmonary:     Effort: Pulmonary effort is normal.     Breath sounds: Normal breath sounds.  Abdominal:     General: Bowel sounds are normal. There is no distension.     Palpations: There is no mass.     Tenderness:  There is no abdominal tenderness.     Hernia: No hernia is present.  Genitourinary:    Comments: deferred Musculoskeletal:     Right lower leg: No edema.     Left lower leg: No edema.  Lymphadenopathy:     Cervical: No cervical adenopathy.  Skin:    General: Skin is warm.  Neurological:     General: No focal deficit present.     Mental Status: He is alert.     Deep Tendon Reflexes:     Reflex Scores:      Bicep reflexes are 2+ on the right side and 2+ on the left side.      Patellar reflexes are 2+ on the right side and 2+ on the left side.    Comments: Bilateral upper and lower extremity strength 5/5  Psychiatric:        Mood and Affect: Mood normal.        Behavior: Behavior normal.        Thought Content: Thought content normal.        Judgment: Judgment  normal.      No results found for any visits on 03/05/24.    The 10-year ASCVD risk score (Arnett DK, et al., 2019) is: 8.8%    Assessment & Plan:   Problem List Items Addressed This Visit       Cardiovascular and Mediastinum   Essential hypertension - Primary   Patient currently on amlodipine  10 mg daily and losartan  100 mg daily.  Blood pressure not controlled in office or at home.  Has tried HCTZ with adverse drug event.  We will switch losartan  to valsartan  160 mg daily.  Continue checking blood pressure at home      Relevant Medications   valsartan  (DIOVAN ) 160 MG tablet   Other Relevant Orders   Comprehensive metabolic panel with GFR   CBC with Differential/Platelet   Hemoglobin A1c   TSH   Lipid panel     Other   Preventative health care   Discussed age-appropriate immunizations and screening exams.  Did review patient's personal, surgical, social, family histories.  Patient is up-to-date with all age-appropriate vaccinations he would like.  Patient declined tetanus and flu vaccine today.  Cologuard ordered for CRC screening.  PSA for prostate cancer screening.  Patient was given information at discharge about preventative healthcare maintenance with anticipatory guidance      Relevant Orders   Comprehensive metabolic panel with GFR   CBC with Differential/Platelet   TSH   Pure hypercholesterolemia   History of the same currently maintained on atorvastatin  10 mg daily.  Pending lipid panel today      Relevant Medications   valsartan  (DIOVAN ) 160 MG tablet   Other Relevant Orders   Lipid panel   Prediabetes   History of the same pending A1c today      Relevant Orders   Hemoglobin A1c   Drug-induced erectile dysfunction   Currently maintained on sildenafil  100 mg daily as needed sexual intercourse.  Continue      Other Visit Diagnoses       Screening for prostate cancer       Relevant Orders   PSA     Screening for colon cancer       Relevant  Orders   Cologuard       Return in about 3 months (around 06/03/2024) for BP recheck.    Adina Crandall, NP

## 2024-03-05 NOTE — Assessment & Plan Note (Signed)
 History of the same currently maintained on atorvastatin  10 mg daily.  Pending lipid panel today

## 2024-03-08 ENCOUNTER — Ambulatory Visit: Payer: Self-pay | Admitting: Nurse Practitioner

## 2024-03-18 ENCOUNTER — Other Ambulatory Visit: Payer: Self-pay | Admitting: Nurse Practitioner

## 2024-03-21 ENCOUNTER — Other Ambulatory Visit: Payer: Self-pay | Admitting: Nurse Practitioner

## 2024-03-21 DIAGNOSIS — I10 Essential (primary) hypertension: Secondary | ICD-10-CM

## 2024-03-22 NOTE — Telephone Encounter (Signed)
 Has office visit scheduled 06/05/23

## 2024-04-02 ENCOUNTER — Encounter: Payer: Self-pay | Admitting: Nurse Practitioner

## 2024-04-08 MED ORDER — LOSARTAN POTASSIUM 100 MG PO TABS: 100.0000 mg | ORAL_TABLET | Freq: Every day | ORAL | 0 refills | Status: AC

## 2024-04-08 NOTE — Addendum Note (Signed)
 Addended by: WENDEE LYNWOOD HERO on: 04/08/2024 04:35 PM   Modules accepted: Orders

## 2024-04-24 ENCOUNTER — Other Ambulatory Visit: Payer: Self-pay | Admitting: Nurse Practitioner

## 2024-04-24 DIAGNOSIS — E7800 Pure hypercholesterolemia, unspecified: Secondary | ICD-10-CM

## 2024-06-04 ENCOUNTER — Ambulatory Visit: Admitting: Nurse Practitioner
